# Patient Record
Sex: Female | Born: 1950 | Race: White | Hispanic: No | Marital: Single | Smoking: Former smoker
Health system: Southern US, Community
[De-identification: ages and names within clinical notes are randomized; demographics above are authoritative.]

## PROBLEM LIST (undated history)

## (undated) DIAGNOSIS — E871 Hypo-osmolality and hyponatremia: Secondary | ICD-10-CM

## (undated) DIAGNOSIS — F319 Bipolar disorder, unspecified: Secondary | ICD-10-CM

## (undated) DIAGNOSIS — R609 Edema, unspecified: Secondary | ICD-10-CM

## (undated) DIAGNOSIS — K219 Gastro-esophageal reflux disease without esophagitis: Secondary | ICD-10-CM

## (undated) DIAGNOSIS — F419 Anxiety disorder, unspecified: Secondary | ICD-10-CM

## (undated) DIAGNOSIS — Z9289 Personal history of other medical treatment: Secondary | ICD-10-CM

## (undated) DIAGNOSIS — I10 Essential (primary) hypertension: Secondary | ICD-10-CM

## (undated) DIAGNOSIS — E079 Disorder of thyroid, unspecified: Secondary | ICD-10-CM

## (undated) DIAGNOSIS — G4733 Obstructive sleep apnea (adult) (pediatric): Secondary | ICD-10-CM

## (undated) DIAGNOSIS — E039 Hypothyroidism, unspecified: Secondary | ICD-10-CM

## (undated) DIAGNOSIS — M7989 Other specified soft tissue disorders: Secondary | ICD-10-CM

## (undated) DIAGNOSIS — E66812 Obesity, class 2: Secondary | ICD-10-CM

## (undated) DIAGNOSIS — H01113 Allergic dermatitis of right eye, unspecified eyelid: Secondary | ICD-10-CM

## (undated) DIAGNOSIS — E8770 Fluid overload, unspecified: Secondary | ICD-10-CM

## (undated) DIAGNOSIS — D7589 Other specified diseases of blood and blood-forming organs: Secondary | ICD-10-CM

## (undated) DIAGNOSIS — Z6835 Body mass index (BMI) 35.0-35.9, adult: Secondary | ICD-10-CM

## (undated) DIAGNOSIS — R748 Abnormal levels of other serum enzymes: Secondary | ICD-10-CM

## (undated) DIAGNOSIS — I5032 Chronic diastolic (congestive) heart failure: Principal | ICD-10-CM

## (undated) DIAGNOSIS — M25552 Pain in left hip: Secondary | ICD-10-CM

## (undated) DIAGNOSIS — R1013 Epigastric pain: Secondary | ICD-10-CM

## (undated) DIAGNOSIS — K209 Esophagitis, unspecified without bleeding: Secondary | ICD-10-CM

## (undated) DIAGNOSIS — Z1211 Encounter for screening for malignant neoplasm of colon: Secondary | ICD-10-CM

## (undated) DIAGNOSIS — K9509 Other complications of gastric band procedure: Secondary | ICD-10-CM

## (undated) HISTORY — DX: Anxiety disorder, unspecified: F41.9

## (undated) HISTORY — DX: Essential (primary) hypertension: I10

## (undated) HISTORY — DX: Obstructive sleep apnea (adult) (pediatric): G47.33

## (undated) HISTORY — DX: Hypothyroidism, unspecified: E03.9

## (undated) HISTORY — PX: LAPAROSCOPIC GASTRIC BANDING: SHX1100

## (undated) HISTORY — DX: Disorder of thyroid, unspecified: E07.9

## (undated) HISTORY — PX: CHOLECYSTECTOMY: SHX55

## (undated) HISTORY — PX: UVULOPALATOPHARYNGOPLASTY: SHX827

## (undated) HISTORY — DX: Bipolar disorder, unspecified: F31.9

## (undated) HISTORY — DX: Gastro-esophageal reflux disease without esophagitis: K21.9

## (undated) HISTORY — PX: ABDOMINAL HYSTERECTOMY: SHX81

## (undated) HISTORY — PX: APPENDECTOMY: SHX54

## (undated) HISTORY — PX: TUBAL LIGATION: SHX77

## (undated) HISTORY — PX: HIP RESURFACING: SHX1760

---

## 1998-10-22 ENCOUNTER — Ambulatory Visit: Admission: RE | Admit: 1998-10-22 | Discharge: 1998-10-22 | Payer: Self-pay | Admitting: Family Medicine

## 1999-07-28 ENCOUNTER — Encounter: Payer: Self-pay | Admitting: Gastroenterology

## 1999-07-28 ENCOUNTER — Encounter (INDEPENDENT_AMBULATORY_CARE_PROVIDER_SITE_OTHER): Payer: Self-pay

## 1999-07-28 ENCOUNTER — Ambulatory Visit (HOSPITAL_COMMUNITY): Admission: RE | Admit: 1999-07-28 | Discharge: 1999-07-29 | Payer: Self-pay | Admitting: *Deleted

## 2002-09-21 ENCOUNTER — Inpatient Hospital Stay (HOSPITAL_COMMUNITY): Admission: EM | Admit: 2002-09-21 | Discharge: 2002-09-22 | Payer: Self-pay | Admitting: Emergency Medicine

## 2002-09-21 ENCOUNTER — Encounter: Payer: Self-pay | Admitting: Emergency Medicine

## 2002-09-22 ENCOUNTER — Encounter: Payer: Self-pay | Admitting: Family Medicine

## 2003-01-25 ENCOUNTER — Encounter (INDEPENDENT_AMBULATORY_CARE_PROVIDER_SITE_OTHER): Payer: Self-pay

## 2003-01-25 ENCOUNTER — Ambulatory Visit (HOSPITAL_COMMUNITY): Admission: RE | Admit: 2003-01-25 | Discharge: 2003-01-25 | Payer: Self-pay | Admitting: Gastroenterology

## 2004-02-03 ENCOUNTER — Other Ambulatory Visit (HOSPITAL_COMMUNITY): Admission: RE | Admit: 2004-02-03 | Discharge: 2004-03-03 | Payer: Self-pay | Admitting: Psychiatry

## 2004-02-03 ENCOUNTER — Ambulatory Visit: Payer: Self-pay | Admitting: Psychiatry

## 2004-02-04 ENCOUNTER — Inpatient Hospital Stay (HOSPITAL_COMMUNITY): Admission: RE | Admit: 2004-02-04 | Discharge: 2004-02-06 | Payer: Self-pay | Admitting: Psychiatry

## 2006-02-11 ENCOUNTER — Encounter: Admission: RE | Admit: 2006-02-11 | Discharge: 2006-02-11 | Payer: Self-pay | Admitting: Internal Medicine

## 2006-03-07 ENCOUNTER — Encounter: Admission: RE | Admit: 2006-03-07 | Discharge: 2006-03-07 | Payer: Self-pay | Admitting: Internal Medicine

## 2006-04-02 ENCOUNTER — Ambulatory Visit: Payer: Self-pay | Admitting: Internal Medicine

## 2006-04-29 ENCOUNTER — Encounter: Admission: RE | Admit: 2006-04-29 | Discharge: 2006-04-29 | Payer: Self-pay | Admitting: Internal Medicine

## 2006-08-17 ENCOUNTER — Encounter: Admission: RE | Admit: 2006-08-17 | Discharge: 2006-08-17 | Payer: Self-pay | Admitting: Internal Medicine

## 2006-10-24 ENCOUNTER — Ambulatory Visit (HOSPITAL_COMMUNITY): Admission: RE | Admit: 2006-10-24 | Discharge: 2006-10-24 | Payer: Self-pay | Admitting: *Deleted

## 2006-10-25 ENCOUNTER — Ambulatory Visit (HOSPITAL_COMMUNITY): Admission: RE | Admit: 2006-10-25 | Discharge: 2006-10-25 | Payer: Self-pay | Admitting: *Deleted

## 2006-11-12 ENCOUNTER — Encounter: Admission: RE | Admit: 2006-11-12 | Discharge: 2006-11-12 | Payer: Self-pay | Admitting: *Deleted

## 2006-11-13 ENCOUNTER — Emergency Department (HOSPITAL_COMMUNITY): Admission: EM | Admit: 2006-11-13 | Discharge: 2006-11-13 | Payer: Self-pay | Admitting: Emergency Medicine

## 2007-01-29 ENCOUNTER — Encounter: Admission: RE | Admit: 2007-01-29 | Discharge: 2007-04-29 | Payer: Self-pay | Admitting: *Deleted

## 2007-02-12 ENCOUNTER — Ambulatory Visit (HOSPITAL_COMMUNITY): Admission: RE | Admit: 2007-02-12 | Discharge: 2007-02-13 | Payer: Self-pay | Admitting: *Deleted

## 2007-09-02 ENCOUNTER — Encounter: Admission: RE | Admit: 2007-09-02 | Discharge: 2007-09-02 | Payer: Self-pay | Admitting: Internal Medicine

## 2008-01-12 ENCOUNTER — Ambulatory Visit: Payer: Self-pay | Admitting: Pulmonary Disease

## 2008-01-12 ENCOUNTER — Inpatient Hospital Stay (HOSPITAL_COMMUNITY): Admission: AD | Admit: 2008-01-12 | Discharge: 2008-01-16 | Payer: Self-pay | Admitting: Internal Medicine

## 2008-01-14 ENCOUNTER — Encounter: Payer: Self-pay | Admitting: Pulmonary Disease

## 2008-01-21 DIAGNOSIS — E0590 Thyrotoxicosis, unspecified without thyrotoxic crisis or storm: Secondary | ICD-10-CM | POA: Insufficient documentation

## 2008-01-21 DIAGNOSIS — G4730 Sleep apnea, unspecified: Secondary | ICD-10-CM | POA: Insufficient documentation

## 2008-01-22 ENCOUNTER — Ambulatory Visit: Payer: Self-pay | Admitting: Pulmonary Disease

## 2008-01-26 DIAGNOSIS — J159 Unspecified bacterial pneumonia: Secondary | ICD-10-CM | POA: Insufficient documentation

## 2008-01-31 DIAGNOSIS — J984 Other disorders of lung: Secondary | ICD-10-CM

## 2010-07-18 NOTE — Consult Note (Signed)
NAMEBERNIECE, ABID                 ACCOUNT NO.:  [id number]   MEDICAL RECORD NO.:  [id number]          PATIENT TYPE:  INP   LOCATION:  6740                         FACILITY:  [facility]   PHYSICIAN:  Oretha Milch, MD      DATE OF BIRTH:  [birth year]   DATE OF CONSULTATION:  01/13/2008  DATE OF DISCHARGE:                                 CONSULTATION   REFERRING PHYSICIAN:  Kari Baars, MD   REASON FOR CONSULTATION:  Nonresolving right upper lobe pneumonia.   HISTORY OF PRESENT ILLNESS:  Ms. Hayworth is a pleasant 60 year old  Caucasian woman who has been having intermittent fevers about once a  week since August 2009.  About 3 weeks ago, she was treated for UTI and  underwent urethral dilatation by Urology.  She then developed persistent  fevers for 3 days up to 102 and right subscapular pain with a chest x-  ray showing a right upper lobe infiltrate on January 07, 2008.  She was  treated with Avelox for 6 days, but her fever persisted.  Temperature of  99.7 was recorded in the office.  She was then admitted for inpatient  treatment of pneumonia having failed oral therapy.  She was initially  treated with ceftriaxone and azithromycin and subsequently this was  brought in to Zosyn and vancomycin on January 13, 2008.  Chest x-ray  showed unchanged right upper lobe peripheral infiltrates superior to the  fissure from January 07, 2008.  CT angiogram showed a dense  consolidation in the right upper lobe posterior segment.  Within this,  there was a low-attenuation focus, worrisome for necrosis.  Focal  opacity was seen in the right lung base measuring 1.0 x 2.4 cm, which  was stable than traced back to January 2008.   She has been afebrile since admission and oxygen saturation has been  good.  She denies sputum production or hemoptysis.  Her chest pain has  improved.   PAST MEDICAL HISTORY:  Includes hypertension, bipolar disorder,  obstructive sleep apnea, status post uvulectomy and  deviated septum  surgery, hypothyroidism, status post lap band procedure in December 2008  with a 70-pound weight loss since then, and colonic polyps.  Past  medical history also includes right lower lobe nodule stable since  January 2008.   PAST SURGICAL HISTORY:  Includes left hip bursa, appendectomy,  cholecystectomy, tubal ligation, and total abdominal hysterectomy with  BSO for fibroids, status post lap band procedure.   ALLERGIES:  CODEINE.   MEDICATIONS PRIOR TO ADMISSION:  1. Lamictal 100 mg p.o. daily.  2. Wellbutrin 300 mg p.o. daily.  3. Xanax 0.5 mg p.o. q.12 h. p.r.n.  4. Synthroid 100 mcg daily.  5. Kay Ciel 20 mEq daily.  6. Norvasc 5 mg daily.  7. 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  She has had a college education.  She is  divorced with 3 children.  She has been sober since 2005, but previously  was a heavy drinker.  She  quit smoking around 2005.  She smoked about a  pack per week then for about 20 years.  No history of drug use.   FAMILY HISTORY:  Brother and sister with alcoholism.  Father had stroke,  coronary artery disease, and gout.  Mother had depression, ALS, and  hypertension.   REVIEW OF SYSTEMS:  Currently, denies change in bowel habits.  A 70-  pound intentional weight loss.  No hemoptysis or chest pain.   PHYSICAL EXAMINATION:  GENERAL:  A __________ woman sitting up in bed in  no apparent respiratory distress.  VITAL SIGNS:  Heart rate 58 per minute, temperature 98.6, respirations  18, blood pressure 99/60, and oxygen saturation 99% on room air.  HEENT:  Normal.  NECK:  Supple.  No JVD.  No lymphadenopathy.  CARDIOVASCULAR:  S1 and S2 are normal.  CHEST:  Clear to auscultation.  ABDOMEN:  Soft and nontender.  NEUROLOGIC:  Nonfocal.  EXTREMITIES:  No edema.   LABORATORY DATA:  BUN and creatinine 7 and 0.9, potassium 3.5, sodium  141, glucose 92, bicarbonate  29, and calcium 9.1.  WBC count 5.5 on  admission, this is 8.1; hemoglobin 12.7; and platelets 287.  HIV is  nonreactive.  Urine strep antigen was negative.  LDH was 118.  LFTs were  normal.   IMAGING STUDIES:  As described above.   IMPRESSION:  1. Right upper lobe posterior segment community-acquired pneumonia.  2. Evidence of necrosis, either necrotizing pneumonia versus lung      abscess.  3. Intermittent fevers since August 2009 with unknown PPD status, but      no clear risk factors for tuberculosis or known exposure.  4. Right lower lobe nodule, stable since January 2008 and is ex-smoker   The differential diagnosis of this CT appearance and nonresolving  infiltrate in spite of antibiotic therapy includes:  1. Lung abscess and necrotizing pneumonia.  Pulmonary infarct seems to      have been ruled out.  Malignancy seems to be less likely.  Chronic      infection such as tuberculosis exists in the differential although      less likely.  Recommend a PPD will be placed.  2. I would continue with the antibiotics as you are doing.  3. I would recommend early sampling with a bronchoscopy to obtain      cultures and AFB and also inspect the right upper lobe posterior      segment to rule out endobronchial lesions.  Aspiration I presume      also exists in the differential diagnosis.  I discussed the      strategy with the patient and she evidenced understanding.  I gave      her the alternative of observation with antibiotic therapy.  The      risks of the procedure including coughing, bleeding, and a small      chance of lung puncture were discussed in detail and she evidenced      understanding.  Bronchoscopy will be planned for January 14, 2008,      a.m.  We will hold the Lovenox prior to the procedure   Thank you Dr. Clelia Croft for involving [place] in the care of this pleasant  patient.  We will be happy to follow up for her pulmonary problems as an  outpatient.      Oretha Milch, MD  Electronically Signed     RVA/MEDQ  D:  01/13/2008  T:  01/13/2008  Job:  [id number] 

## 2010-07-18 NOTE — H&P (Signed)
Sharon Mckee, Sharon Mckee                 ACCOUNT NO.:  [id number]   MEDICAL RECORD NO.:  [id number]          PATIENT TYPE:  INP   LOCATION:  6740                         FACILITY:  [facility]   PHYSICIAN:  Kari Baars, M.D.  DATE OF BIRTH:  [birth year]   DATE OF ADMISSION:  01/12/2008  DATE OF DISCHARGE:                              HISTORY & PHYSICAL   .   CHIEF COMPLAINT:  Fever, cough.   HISTORY OF PRESENT ILLNESS:  Sharon Mckee is a 60 year old [place] female  with a history of bipolar disorder, obesity, status post Lap-Band  procedure (December 2008), who presented to the office today with  persistent fever and cough.  The patient was initially seen on January 07, 2008, for intermittent fever, which has been off and on since August  2009.  This is up to 102 degrees since that time.  Initially, she fell  like her symptoms improved after treatment with antibiotics for urinary  tract infection.  She was seen by Urology and underwent a urethral  dilatation about 3 weeks ago.  However, she continued to have  intermittent fever up to 102 for the 3 days prior to her visit on  January 07, 2008.  This was associated with right subscapular pain.  At  that time, she denied any cough, dysuria, night sweats, fatigue, joint  pain, rash, or travel.  Chest x-ray was performed due to her subscapular  pain and did reveal a right upper lobe infiltrate.  She was treated with  Avelox as an outpatient.  However, she called back this morning stating  that she was having persistent fatigue, cough, and fever up to 102.5  despite completing 6 days of Avelox thus far.  She was reevaluated in  the office where her temperature was 99.7, and chest x-ray showed a  persistent right upper lobe infiltrate, unchanged from prior x-rays.  Now, she does endorse night sweats.  She has had some weight loss, which  was attributed to her Lap-Band in the past.  Minimal HIV risk factors  including no sexual activity for the past  year.  She has not had any  hemoptysis.   PAST MEDICAL HISTORY:  1. Hypertension.  2. Bipolar disorder.  3. Hypothyroidism.  4. Obstructive sleep apnea, status post uvulectomy/deviated septum.  5. 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  6. Right lung base pulmonary nodule (January 2008) - stable in June      2009.  7. Obesity, status post Lap-Band procedure.  8. History of colon polyps.  9. Status post total abdominal hysterectomy/bilateral salpingo-      oophorectomy secondary to fibroids.  10.Status post left hip bursa surgery.  11.Status post appendectomy.  12.Status post cholecystectomy.  13.Status post bilateral tubal ligation.   CURRENT MEDICATIONS:  1. Synthroid 100 mcg daily.  2. Xanax 5 mg p.r.n.  3. Wellbutrin 300 mg daily.  4. Lamictal 100 mg daily.  5. KCl 20 mEq daily.  6. Diovan HCT 320/25 daily.  7. Norvasc 5 mg daily.   ALLERGIES:  CODEINE.   SOCIAL HISTORY:  She is divorced, with  3 children and 1 grandchild.  She  has college education at [employer].  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  She quit  smoking in 2005.  Has previously been a heavy drinker with up to a  bottle of wine per day, none recently.  No drug use.   FAMILY HISTORY:  Father had stroke, coronary artery disease, and gout.  Mother had hypertension, ALS, and depression.  She has a sister with  hemochromatosis and alcoholism.  Another brother with alcoholism.  She  is a brother who died at age of 25, secondary to leukemia.  Her children  are healthy.   PHYSICAL EXAMINATION:  VITAL SIGNS:  Temperature 99.7, blood pressure  102/64, pulse 74, respirations 80, and oxygen saturation 98% on room  air.  GENERAL:  Fatigued, pale-appearing Sharon Mckee female.  HEENT:  No scleral icterus.  Pupils are equal, round, and reactive to  light.  Extraocular movements are intact.  Oropharynx is moist without  erythema.  NECK:  Supple without  lymphadenopathy, JVD, or carotid bruits.  HEART:  Regular rate and rhythm without murmurs, rubs, or gallops.  LUNGS:  Clear to auscultation bilaterally.  No wheezing or rhonchi.  No  egophony.  ABDOMEN:  Soft, nondistended, and nontender with normoactive bowel  sounds.  EXTREMITIES:  No clubbing, cyanosis, or edema.  SKIN:  No rashes.  NEUROLOGIC:  Alert and oriented x4.   LABORATORY DATA:  CBC performed on January 07, 2008, showed a Vivas  count of 8.6, hemoglobin 13.3, and platelets 284.  Peripheral blood  smear was obtained at that time and showed 2 bands, 72% segs, and 22%  lymphs.  Urinalysis at that time was negative.   STUDIES:  Chest x-ray shows a right upper lobe peripheral infiltrate  superior to the fissure.  No change from prior chest x-ray on January 07, 2008.   ASSESSMENT AND PLAN:  1. Persistent fever, night sweats, fatigue in the setting of a      persistent right upper lobe infiltrate despite Avelox therapy - her      history is a bit concerning for an atypical presentation such as      malignancy, wedge infarct, or resistant pneumonia.  We will admit      to the hospital for IV antibiotics including Rocephin and      azithromycin.  Continue antipyretics p.r.n.  We will obtain a CT of      the chest with contrast (PE protocol) to rule out PE, wedge      infarct, malignancy, and to further evaluate pneumonia.  We will      obtain sputum and blood cultures.  Consider pulmonary consult for      bronchoscopy versus percutaneous evaluation if malignancy is of      concern.  2. Hypothyroidism - continue Synthroid.  TSH last checked on April      2009 was normal.  We will recheck TSH.  3. Weight loss - this is likely secondary to Lap-Band procedure though      malignancy is of concern given above symptoms.  4. Disposition - anticipate return once evaluation and treatment is      complete.      Kari Baars, M.D.  Electronically Signed     WS/MEDQ  D:   01/12/2008  T:  01/13/2008  Job:  [id number]

## 2010-07-18 NOTE — Discharge Summary (Signed)
Sharon Mckee, Sharon Mckee                 ACCOUNT NO.:  [id number]   MEDICAL RECORD NO.:  [id number]          PATIENT TYPE:  INP   LOCATION:  6740                         FACILITY:  [facility]   PHYSICIAN:  Oretha Milch, MD      DATE OF BIRTH:  1950-03-15   DATE OF ADMISSION:  01/12/2008  DATE OF DISCHARGE:                               DISCHARGE SUMMARY   PROCEDURE PERFORMED:  Bronchoscopy.   INDICATION FOR THE PROCEDURE:  Right upper lobe consolidation with  central necrosis, not responding to outpatient therapy with oral  antibiotics and ex-smoker.  Written and informed consent was obtained  from the patient prior to procedure.  Risks of the procedure including  coughing, bleeding, and a small chance of lung puncture requiring a  chest tube were discussed in detail and she verbalized understanding.   A 5 mg of Versed and 150 mcg of fentanyl were used in divided doses.  Bronchoscope was inserted from the right nare, upper airway appeared  normal.  Vocal cord showed normal appearance and motion.  All segmental  and subsegmental airways appeared patent including the right upper lobe  airways appeared patent.  Some whitish phlegm was noted on the right-  sided airways.  The brushings and transbronchial biopsies were obtained  from the right upper lobe posterior segment using fluoroscopy about 40  mL of bronchoalveolar lavage was then instilled and good return  obtained.  Specimens were sent for AFB culture, fungal culture and  cytology and pathology.  The patient tolerated the procedure well.  A  chest x-ray will be performed to rule out the presence of pneumothorax.      Oretha Milch, MD  Electronically Signed     RVA/MEDQ  D:  01/14/2008  T:  01/14/2008  Job:  [id number]

## 2010-07-18 NOTE — Discharge Summary (Signed)
Sharon Mckee, Sharon Mckee                 ACCOUNT NO.:  [id number]   MEDICAL RECORD NO.:  [id number]          PATIENT TYPE:  INP   LOCATION:  6740                         FACILITY:  [facility]   PHYSICIAN:  Kari Baars, M.D.  DATE OF BIRTH:  [birth year]   DATE OF ADMISSION:  01/12/2008  DATE OF DISCHARGE:  01/16/2008                               DISCHARGE SUMMARY   DISCHARGE DIAGNOSES:  1. Necrotizing right upper lobe pneumonia.  2. Fever secondary to necrotizing right upper lobe pneumonia.  3. Hypotension likely secondary to antihypertensive therapy and volume      depletion, resolved.  4. Bipolar disorder with mild mania.  5. Hypothyroidism.  6. Obstructive sleep apnea, status post uvulectomy/deviated septum.  7. History of alcoholism, (none since 2005).  8. Right lung base pulmonary nodule, stable since January 2008.  9. Obesity, status post Lap Band procedure.  10.History of colon polyps.  11.Status post total abdominal hysterectomy/bilateral salpingo-      oophorectomy secondary to fibroids.  12.Status post left hip bursa surgery.  13.Status post appendectomy.  14.Status post cholecystectomy.  15.Status post bilateral tubal ligation.   DISCHARGE MEDICATIONS:  1. Augmentin 875 mg b.i.d. x10 days.  2. Lamictal 300 mg daily.  3. Levothyroxine 112 mcg daily.  4. Wellbutrin-XL 300 mg daily.  5. Xanax 1 mg q.8 h. p.r.n.   HOSPITAL PROCEDURES:  1. CT angiogram of the chest (January 12, 2008) dense consolidation in      the right upper lobe consistent with pneumonia with evidence of      pulmonary necrosis (1.2 cm x 1.6 cm x 1.7 cm), no change in 1.0 x      2.4 cm right lung base with opacity.  No pulmonary embolism.  2. Bronchoscopy (January 14, 2008) by Dr. Vassie Loll - Acid-fast bacilli,      fungus, and routine bacterial cultures negative.  Pathology shows      inflammation associated with fibrinous deposits and focal fibrosis      consistent with an acute inflammatory process.   Cytology, benign      reactive changes with acute inflammation.  3. Multiple chest x-rays revealing slow improvement.   HISTORY OF PRESENT ILLNESS:  For full details, please see dictated  history and physical.  Briefly, Sharon Mckee is a 60 year old [place] female  with history of bipolar disorder, obesity, status post Lab Band  procedure (December 2008) who presented to the office with persistent  fever and cough.  The patient has had intermittent fevers since August  2009 with spiking temperatures up to 102.  Initially, her symptoms  improved following a course of antibiotics for urinary tract infection.  She subsequently underwent urethral dilatation about 3 weeks ago.  However, after completion of these antibiotics, she continued to have  fevers up to 102 and presented to the office on January 07, 2008.  At  that time, she also reported right subscapular pain, but denied any  cough, night sweats, or sputum production.  Chest x-ray was performed  that showed a dense right upper lobe infiltrate.  She was treated with  Avelox  as an outpatient.  However, despite this treatment, she continued  to have fever up to 102.5 and returned to the office on January 12, 2008.  Repeat chest x-ray showed no significant improvement in her right  upper lobe infiltrate.  Therefore, she was admitted for further  management.   HOSPITAL COURSE:  The patient was admitted to a medical bed under  respiratory isolation.  She was empirically placed initially on Rocephin  and azithromycin.  Chest CT was performed to further evaluate the  consolidation and it did show a dense right upper lobe consolidation  with an area of necrosis consistent with necrotizing pneumonia.  No  definite abscess was seen.  Given this appearance, her antibiotics were  changed from those mentioned to vancomycin and Zosyn to cover both  anaerobic and resistant staph (MRSA).  Blood cultures were obtained on  admission and were negative.   Pulmonology was consulted and recommended  a bronchoscopy that was performed on January 14, 2008.  Cultures and  cytology from this were negative.  Cytology, as above, consistent with  an acute inflammatory change.  Of note, the patient had an immediate  reaction to vancomycin with left arm burning and lip tingling.  Therefore, the vancomycin was changed to Zyvox on January 05, 2008.  With Zyvox and Zosyn, her fever resolved and her right upper lobe  infiltrate slowly improved on chest x-ray.  At this point, it is felt  that she is showing signs of clinical improvement.  Given negative  cultures for MRSA, Dr. Vassie Loll recommended switching to p.o. Augmentin to  complete at least a 14-day course of antibiotics with close outpatient  followup.  At this time, her Zyvox will be discontinued and she will be  discharged on p.o. Augmentin.   The patient also had some episodes of hypotension throughout her  hospitalization, which were probably more related to volume depletion  and antihypertensive therapy in the setting of her weight loss from her  Lap Band.  All of her antihypertensives have been held and her blood  pressure has stabilized.  She has not shown any other signs of sepsis as  her fever has resolved and she has had no other end-organ effects.  She  will be discharged home without any antihypertensive therapy with close  outpatient followup.   DISCHARGE LABS:  Urinary strep antigen negative, urinary Legionella  antigen negative, blood cultures negative.  Bronchial washing cultures  negative for routine, fungus, and AFB.  TSH was increased was elevated  at 17.7.  Her Synthroid was increased as a result.  HIV antibody was  negative.  Peripheral blood smear was obtained with no specific report.  Of note, this was normal in office.  LDH was normal.  Random cortisol  12.6.  CBC on the day of discharge 4.2, hemoglobin 11.8, platelets 320.  Interestingly, her [place] blood count has been normal  throughout this  course.  BMET significant for sodium 145, potassium 3.9, chloride 113,  BUN 2, creatinine 0.9, glucose 89.   DISCHARGE INSTRUCTIONS:  She was instructed to call if she has  increasing shortness of breath, fever, chest pain, or purulent sputum  production.  If she does develop fever, the Zyvox should be restarted.   DISCHARGE DIET:  Cardiac prudent diet as tolerated.   DISCHARGE ACTIVITY:  She was instructed to remain out of work for about  1 week, but otherwise has no restrictions.   HOSPITAL FOLLOWUP:  She will follow up with Dr. Clelia Croft  on the January 19, 2008, for repeat chest x-ray and blood pressure followup.  She should  follow up with Dr. Vassie Loll in 1 week as instructed.   DISPOSITION:  To home.      Kari Baars, M.D.  Electronically Signed     WS/MEDQ  D:  01/16/2008  T:  01/16/2008  Job:  [id number]   cc:   Oretha Milch, MD

## 2010-07-18 NOTE — Op Note (Signed)
[name], Sharon Mckee                 ACCOUNT NO.:  [id number]   MEDICAL RECORD NO.:  [id number]          PATIENT TYPE:  OIB   LOCATION:  1539                         [facility]   PHYSICIAN:  Alfonse Ras, MD   DATE OF BIRTH:  1950/03/06   DATE OF PROCEDURE:  DATE OF DISCHARGE:                               OPERATIVE REPORT   PREOPERATIVE DIAGNOSIS:  Medically refractory morbid obesity.   POSTOPERATIVE DIAGNOSIS:  Medically refractory morbid obesity.   PROCEDURE:  Laparoscopic adjustable gastric banding with APS system.   SURGEON:  Alfonse Ras, MD.   ASSISTANTThornton Park. Daphine Deutscher, MD.   ANESTHESIA:  General.   DESCRIPTION OF PROCEDURE:  The patient was taken to the operating room  and placed in a supine position.  After general anesthesia was induced  using endotracheal tube, the abdomen was prepped and draped in normal  sterile fashion.  Using a 11-mm Optiview trocar in the left upper  quadrant under direct vision, peritoneal access was obtained.  Pneumoperitoneum was obtained.  Additional 11 and 15 mm trocars were  placed in the left abdomen and an 11-mm trocar was placed in the left  periumbilical region.  Dense adhesions were taken down from the lesser  curvature of the stomach into the falciform ligament.  This was  accomplished with a harmonic scalpel.  The angle of His was identified  and sharply and bluntly dissected.  An area of crossing fat on the right  crus of the diaphragm was identified and using the pars flaccida  technique, I passed the band passer in a retrogastric position and  brought it out of the angle of His.  An APS band was then placed through  the 15-mm port and passed around the stomach.  The sizing tube had been  placed down oro-gastrically and into the stomach.  It was snapped in  place.  A fundoplication was performed in the anterior aspect of the  stomach in a serosa to serosa fashion using #2 Ethibond sutures.  The  tubing was brought  out through the 11-mm port in the right upper  quadrant.  Incision was extended and the port was affixed to the tubing  and then affixed with interrupted 2-0 Prolene sutures to the anterior  rectus fascia.  The subcutaneous tissue was closed with subcutaneous 2-0  Vicryl suture.  The skin incisions were closed with staples.  All  tissues were injected using 0.5 Marcaine.  Sterile dressings were  applied.  The patient tolerated the procedure well and went to PACU in  good condition.      Alfonse Ras, MD  Electronically Signed     KRE/MEDQ  D:  02/12/2007  T:  02/13/2007  Job:  [id number]

## 2010-07-21 NOTE — Assessment & Plan Note (Signed)
[facility]                         GASTROENTEROLOGY OFFICE NOTE   NAME:Sharon Mckee, Sharon Mckee                        MRN:          [id number]  DATE:04/02/2006                            DOB:          04/05/1950    REASON FOR CONSULTATION:  History of diverticulitis and ongoing left  lower quadrant abdominal pain.   HISTORY:  This is a pleasant 60 year old [place] female with a history of  hypertension, hypothyroidism, anxiety/depression, and sleep apnea. She  is referred through the courtesy of Dr. Clelia Croft for persistent lower  abdominal pain presumed secondary to diverticulitis. The patient did  undergo colonoscopy with Dr. Carman Ching in November of 2004. This to  evaluate rectal bleeding. The examination was complete to the cecum with  a good preparation. The patient was said to have a descending colon  polyp, which was removed, and found to be hyperplastic. She also had  internal hemorrhoids, which were felt to be the source of bleeding.  There was no mention of diverticular change. The patient had been doing  well until recent months when she has noticed some very mild left lower  quadrant abdominal discomfort. However, in the early part of December  developed severe abdominal pain. She was seen by Dr. Clelia Croft on February 11, 2006. CBC was normal. CT scan of the abdomen and pelvis revealed  short segment sigmoid diverticulitis without perforation or abscess. She  reports to me being treated with 10-day course of two antibiotics  including metronidazole. She was not certain about the other antibiotic.  Symptoms improved, though only about 50%. She was treated with a second  course of antibiotics for ten days, again with only minimal improvement.  She was also incidentally noted to have a lesion in the right lung,  which is being followed radiographically. In addition to constant  nagging and intermittent cramping in the left lower quadrant, the  patient has  had bowel habits which have oscillated between constipation  and diarrhea. She notices some rectal bleeding due to her hemorrhoids.  Her appetite has improved recently and her weight has been steady.  Initially, she had fevers, but has not had fevers since her early  presentation in December.   PAST MEDICAL HISTORY:  1. Hypertension.  2. Hypothyroidism.  3. Anxiety with depression.  4. Sleep apnea.   PAST SURGICAL HISTORY:  1. Cholecystectomy.  2. Hysterectomy.  3. Tubal ligation.  4. Appendectomy.  5. Surgery for sleep apnea.   ALLERGIES:  CODEINE CAUSES ITCHING.   CURRENT MEDICATIONS:  1. Synthroid 50 mcg daily.  2. Wellbutrin 300 mg daily.  3. Provigil 200 mg daily.  4. Diovan hydrochlorothiazide 320/25 mg daily.  5. Potassium chloride 20 mEq daily.  6. Lamictal 100 mg daily.   FAMILY HISTORY:  No family history of gastrointestinal malignancy or  inflammatory bowel disease.   SOCIAL HISTORY:  The patient is divorced with three children. She lives  alone. She has a bachelor's degree. 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 She smokes a pack of cigarettes per week. Uses  alcohol and describes herself as  having rebound alcoholism.   REVIEW OF SYSTEMS:  Per Diagnostic Evaluation Form.   PHYSICAL EXAMINATION:  The patient is alert and oriented. Blood pressure  is 130/76, heart rate is 60 and regular. Her weight is 217.8 pounds. She  is 5 feet, 2 inches in height.  HEENT: Sclerae anicteric. Conjunctivae are pink. Oral mucosa intact.  There is no adenopathy.  LUNGS:  Are clear.  HEART: Is regular.  ABDOMEN: Obese and soft with mild to moderate  tenderness to deep  palpation in the left lower quadrant. No rebound or guarding. No  mass.  Good bowel sounds heard.  RECTAL: Examination was omitted.  EXTREMITIES: Are without edema.   IMPRESSION:  Persistent left lower quadrant abdominal pain after a  documented episode of diverticulitis. Significant, though  incomplete,  improvement after prior courses of antibiotic therapy. 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   RECOMMENDATIONS:  1. Additional and longer course of antibiotics. Will prescribe      Augmentin 875 mg p.o. b.i.d. for two weeks.  2. Low residue diet.  3. Darvocet as needed for pain.  4. Office visit in about three weeks. If symptoms persist, consider      repeat imaging study or possibly repeat colonoscopy.     Wilhemina Bonito. Marina Goodell, MD  Electronically Signed    JNP/MedQ  DD: 04/02/2006  DT: 04/02/2006  Job #: [id number]   cc:   Kari Baars, M.D.

## 2010-07-21 NOTE — Op Note (Signed)
NAME:  Sharon Mckee, Sharon Mckee                             ACCOUNT NO.:  [id number]   MEDICAL RECORD NO.:  [id number]                   PATIENT TYPE:  AMB   LOCATION:  ENDO                                 FACILITY:  [facility]   PHYSICIAN:  James L. Malon Kindle., M.D.          DATE OF BIRTH:  September 14, 1950   DATE OF PROCEDURE:  01/25/2003  DATE OF DISCHARGE:                                 OPERATIVE REPORT   PROCEDURE:  Colonoscopy and polypectomy.   MEDICATIONS:  Fentanyl 100 mcg, Versed 12 mg IV.   SCOPE:  Olympus pediatric colonoscope.   INDICATIONS:  Rectal bleeding.   DESCRIPTION OF PROCEDURE:  The procedure had been explained to the patient  and consent obtained.  With the patient in the left lateral decubitus  position, the Olympus scope was inserted and advanced.  The prep was quite  good.  Using abdominal pressure, we were able to reach the cecum.  The  ileocecal valve and appendiceal orifice seen,  the scope withdrawn and the  cecum, ascending colon, transverse colon, and descending colon were seen  well.  At approximately 60 cm from the anal verge, a 0.75 cm sessile polyp  was encountered.  Initially this looked like it might have been a lipoma.  I  did biopsy it.  I did not see any yellow fat coming from it and upon  cleaning it, it did seem to have more of a villous nature to it and seemed  to be somewhat harder than a lipoma.  We then snared it and the pieces were  recovered.  There was no significant bleeding at the termination of the  procedure.  The scope was withdrawn.  No other polyps were seen in the  descending colon or sigmoid.  The rectum did reveal moderate size internal  hemorrhoids.  The scope was withdrawn. The patient tolerated the procedure  well.   ASSESSMENT:  1. Descending colon polyp, snared.  211.3.  2. Internal hemorrhoids.  455.0.  3. Rectal bleeding, probably due to the hemorrhoids.  578.1.   PLAN:  Will go ahead and check the past.  Routine post polypectomy  instructions and hemorrhoid instructions.  See back in the office in two  months.                                               James L. Malon Kindle., M.D.    Waldron Session  D:  01/25/2003  T:  01/25/2003  Job:  [id number]   cc:   Al Decant. Janey Greaser, MD  [address]  [place]  [place] [address]  Fax: [phone]

## 2010-07-21 NOTE — Discharge Summary (Signed)
Sharon Mckee, Sharon Mckee                 ACCOUNT NO.:  [id number]   MEDICAL RECORD NO.:  [id number]          PATIENT TYPE:  IPS   LOCATION:  0301                          [facility]ACILITY:  [facility]   PHYSICIAN:  Jeanice Lim, M.D. DATE OF BIRTH:  [birth year]   DATE OF ADMISSION:  02/04/2004  DATE OF DISCHARGE:  02/06/2004                                 DISCHARGE SUMMARY   IDENTIFYING DATA:  This is a 60 year old divorced Caucasian female  presenting yesterday, planning to see [name] to begin outpatient  therapy on Monday, confessed to having suicidal thoughts, confided that she  was trying to locate somewhat that could kill her. Stated that a lot of  this may have been related to alcohol and aware of the risk of drinking and  the family history. The patient has been on multiple antidepressants in the  past. Recently evaluated at [facility] and was diagnosed with  bipolar. Advised to seek treatment regarding alcohol use and was sent here  to see [name] and Dr. Dub Mikes. The patient no longer was trying to get  someone to kill her and is not considering killing or hurting her boss.   MEDICATIONS:  1.  Abilify.  2.  Effexor.  3.  Wellbutrin.  4.  Diovan.  5.  Synthroid.  6.  Xanax p.r.n.  7.  Trazodone p.r.n. insomnia.   PHYSICAL EXAMINATION:  Essentially within normal limits.   ROUTINE ADMISSION LABORATORY DATA:  Essentially within normal limits.   MENTAL STATUS EXAM:  The patient was alert and oriented _______________ .  Anxious, depressed, somewhat labile. Denied having acute suicidal or  homicidal ideation. No evidence of psychotic symptoms. Cognition intact.  Judgment and insight fair.   ADMISSION DIAGNOSES:   AXIS I:  Alcohol dependence and bipolar disorder, likely type 1, depressed  phase.   AXIS II:  Deferred.   AXIS III:  1.  Hypertension.  2.  Hypothyroidism.  3.  Rule out sleep apnea.   AXIS IV:  Severe, occupational issues.   AXIS V:  35/55 to  60.   HOSPITAL COURSE:  The patient was admitted and ordered routine p.r.n.  medications, placed on safety checks, encouraged to participate in  individual, group, and milieu therapy. Plan was to wean patient gradually  off of Effexor and have follow up with Dr. Dub Mikes and [name] as  previously planned on following detox. The patient was safely detoxed  without complications, stabilized on medications, and reported doing well  with clinical interventions and was discharged in improved condition with no  acute withdraw symptoms, improved mood. No dangerous ideation including  suicidal or homicidal ideation. No psychotic symptoms. Has reported  motivation to remain abstinent. Follow up with after care as arranged and  was given medication education.   DISCHARGE MEDICATIONS:  1.  Diovan 160 mg 2 q.a.m.  2.  Librium 25 mg 1 three times a day for 1 day, 2 times a day for 1 day,      then 1 time a day for 1 day, and then stop.  3.  Ambien  10 mg q.h.s. p.r.n.  4.  Wellbutrin XL 1 q.a.m.  5.  Synthroid 25 mcg q.a.m.  6.  Abilify 20 mg q.a.m.  7.  Trazodone 100 mg at 9 p.m.   FOLLOW UP:  The patient was to follow up with the [facility]  CDIOP dual diagnoses program with Dr. Dub Mikes and [name].   DISCHARGE DIAGNOSES:   AXIS I:  Alcohol dependence and bipolar disorder, likely type 1, depressed  phase.   AXIS II:  Deferred.   AXIS III:  1.  Hypertension.  2.  Hypothyroidism.  3.  Rule out sleep apnea.   AXIS IV:  Severe, occupational issues.   AXIS V:  55.     Sharon Mckee   JEM/MEDQ  D:  03/01/2004  T:  03/01/2004  Job:  [id number]

## 2010-07-21 NOTE — Op Note (Signed)
Sharon Mckee. [facility]  Patient:    Sharon Mckee, Sharon Mckee                          MRN: [id number] Proc. Date: 07/28/99 Adm. Date:  [id number] Disc. Date: [id number] Attending:  Carlena Sax CC:         Sharon Mckee, M.D.                           Operative Report  PREOPERATIVE DIAGNOSES:  Moderate obstructive sleep apnea, nasal airway obstruction, nasal septal deviation, inferior turbinate hypertrophy and elongated soft palate and uvula.  POSTOPERATIVE DIAGNOSES:  Moderate obstructive sleep apnea, nasal airway obstruction, nasal septal deviation, inferior turbinate hypertrophy and elongated soft palate and uvula.  PROCEDURE PERFORMED:  Uvulopalatopharyngoplasty, nasal septal reconstruction and intramural cauterization, both inferior turbinates.  SURGEON:  Veverly Fells. Arletha Grippe, M.D.  ANESTHESIA:  General endotracheal.  INDICATIONS FOR SURGERY:  This is a 60 year old Sharon Mckee female who gives a long history of nasal airway obstruction, daytime somnolence, morning fatigue and loud snoring.  She had an inpatient sleep test performed by Dr. Dellis Anes. Heller on October 22, 1998 which did show moderate obstructive sleep apnea with an RDI of 38 events per hour and desaturations down to 90%. She has attempted nasal CPAP but this has been very difficult for her to tolerate due to nasal congestion and problems with nasal anatomy.  Physical examination does show a moderate septal deviation to the left with boggy congestion of the inferior turbinates, elongated soft palate and uvula; she is status post tonsillectomy in the past.  Based on her history, physical examination and a failure of aggressive medical therapy, I have recommended proceeding with the above-noted surgical procedure.  I have discussed extensively with her the risks and benefits of surgery including risks of general anesthesia, infection and bleeding and possible nasal septal perforation and uvulopharyngeal  insufficiency with her.  I have entertained questions, answered them appropriately, informed consent has been obtained and patient presents for the above-noted procedure.  OPERATIVE FINDINGS:  Septal deviation to the left, inferior turbinate congestion and hypertrophy, elongated soft palate and uvula.  DETAILS OF PROCEDURE PERFORMED:  Patient was brought into the operating room and placed in supine position and general endotracheal anesthesia administered via the anesthesiologist without complications.  Patient was administered Ancef 1 g IV x 2 and Decadron 10 mg IV x 1.  The head of the table was turned 90 degrees.  Patients face was draped in standard fashion.  Crowe-Davis mouth retractor was inserted into the oral cavity and this was used to retract the mouth open.  Harmonic scalpel was then used to dissect a portion of the anterior tonsillar pillars bilaterally off of the muscle, such that I was taking down scar tissue and releasing vertical scar bands.  A horizontal incision was made through the soft palpate just above the uvula attachment; this was made with the harmonic scalpel and the uvulopalatopharyngoplasty was performed in this fashion to create a posteriorly based trap-door flap.  After this was done, the uvula and portion and soft palate were removed through the oral cavity and bleeding was controlled with suction cautery without difficulty.  Anterior and posterior tonsillar pillars and cut edges of the palate were reapproximated with interrupted 3-0 Vicryl suture without difficulty and a total of 4 cc of 0.5% Marcaine solution and 1:200,000 epinephrine were infiltrate into the soft palate and  tongue base bilaterally. Crowe-davis mouth retractors were released and brought through oral cavity without incident.  Next, attention was turned to the nasal chamber.  Cotton pledgets soaked in a 4% cocaine solution were placed in both nares, left in place for approximately 5 to 10  minutes and then removed.  Both sides of the septum were infiltrated with a 1% lidocaine solution and 1:100,000 epinephrine.  After waiting approximately 5 to 10 minutes, a standard Killian incision was made on the left side of the septum.  Mucoperichondrial and mucoperiosteal flap was elevated on the left side using both blunt and sharp dissection.  An intercartilaginous incision was made approximately 1 cm posterior from the caudal line of the septum.  Mucoperichondrial and mucoperiosteal flap was elevated on the right side using both blunt and sharp dissection.  Cartilaginous deviation was removed from the ______ , posterior bony deflection removed with the Jansen-Middleton forceps.  A piece of morcellized cartilage was placed in between the septal flaps and septal incision was closed with interrupted chromic suture and the septum was reinforced with a transseptal plain gut mattress stitch.  Both inferior turbinates were then injected with a total of 6 cc of a 1% lidocaine solution with 1:100,000 epinephrine.  Both inferior turbinates were then intramurally cauterized using the Elmed intramural cauterization unit set on a 12-watt setting, three passes through both inferior turbinates were performed without difficulty and both inferior turbinates were then out-fractured using gentle lateral pressure with a large nasal speculum.  ______ splint soaked in a Bactroban ointment solution were placed on either side of the septum and held in place with a transseptal Prolene suture.  The oral cavity was suctioned dry.  There was no evidence of any active bleeding and an oral gastric tube was placed; this was to remove any stomach contents and it was removed without incident.  Fluids given during procedure:  Approximately 1 L of crystalloid. Estimated blood loss was less than 30 cc.  Urine output was not measured. There were no drains.  As above noted, two ______ splints were placed. Specimens sent  were septal cartilage and bone for gross pathology only and portion of uvula and soft palate.  Patient tolerated procedure well without  complications and was extubated in the operating room and transferred to the recovery room in stable condition.  Sponge, instrument and needle counts were correct at the end of the procedure.  Total duration of procedure was approximately one and one-half hours.  Patient will be admitted for overnight recovery and once she has recovered well, she will be sent home on Jul 29, 1999.  She will be sent home on Augmentin elixir 400 mg p.o. t.i.d. for two weeks and Lortab elixir 250 cc with two refills, 10 to 15 cc p.o. q.4h. p.r.n. pain.  She is to have light activity, no heavy lifting or nose blowing for two weeks and a post-tonsillectomy diet for two weeks.  Both she and her son were given oral and written instructions.  They are to call with any problems of bleeding, fever or vomiting, pain, reaction to medications or any other questions.  She will follow up in the office for splint removal Thursday, May 31st, at 4:30 p.m. DD:  07/28/99 TD:  08/01/99 Job: [id number]

## 2010-07-21 NOTE — H&P (Signed)
Sharon Mckee, Sharon Mckee                 ACCOUNT NO.:  [id number]   MEDICAL RECORD NO.:  [id number]          PATIENT TYPE:  IPS   LOCATION:  0301                          [facility]ACILITY:  [facility]   PHYSICIAN:  Geoffery Lyons, M.D.      DATE OF BIRTH:  Mar 03, 1951   DATE OF ADMISSION:  02/04/2004  DATE OF DISCHARGE:                         PSYCHIATRIC ADMISSION ASSESSMENT   IDENTIFYING INFORMATION:  This is a 60 year old, divorced, [name] female.  Apparently she presented yesterday for an assessment.  She was planning to  come to see [name] and to begin outpatient therapy this coming Monday.  Somewhere during the interview, she confessed to having had suicidal  ideation.  The last active suicidal ideation was two weeks ago, and she  confided that she had thought about trying to locate someone who could kill  her.  Today, she states that a lot of this may have been the alcohol.  She  is not trying to minimize it.  She shows me her hand tremor.  She says this  is familial, her children have it, etc., etc., but she realizes that she  does need to stop drinking.  She confesses that she has stopped in the past;  however, she recently relapsed and she is drinking to the point that she  passes out.  She is embarrassing her children, etc.  She also confides that  since at least 1987, she has been on antidepressants.  Initially it was  Prozac in 1987.  She had been on other antidepressants prior to that.  She  was recently evaluated at [facility] where she was diagnosed as  bipolar depressed.  They also advised her to seek treatment regarding her  alcohol and was sent over here to [facility] to see [name].  [name], in turn, wanted her to see Dr. Dub Mikes, who she did see  a few days ago.  He was in agreement with weaning her off of her Effexor and  beginning Lamictal.  She has not at this point started the Lamictal.  Today  she states that she is no longer thinking about  getting somewhat else to  kill her, and she is not thinking of harming her boss.   PAST PSYCHIATRIC HISTORY:  As already stated, she has not had any formal  inpatient treatment.  She has been treatment outpatient-wise on  antidepressants for years.   SOCIAL HISTORY:  She has one B.S.  She will finish her second B.S. in May.  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  She has been divorced twice.  She has three  children, two sons, ages 27 and 9, and a daughter, age 71.   FAMILY HISTORY:  Everybody has issues with alcohol, marijuana.  Her mom had  a nervous breakdown when she was about the patient's age.   ALCOHOL AND DRUG HISTORY:  She says she likes to drink.  She drinks a bottle  of wine a night.  Does not drink during the day or at  work.   PRIMARY CARE [place] [facility]abe:  She has had her first appointment with Dr. Clelia Croft.  Medical problems identified so far are hypertension, hypothyroidism, and she  is scheduled for a sleep apnea test this coming Tuesday.  Apparently she has  had surgery in the past; however, she is still having issues with sleep.   CURRENT MEDICATIONS:  1.  Abilify 20 mg p.o. daily.  2.  Effexor is being weaned off.  She came in on 112.5 mg.  She was to be      cut down to 75 mg and then 37.5 mg and then stop.  3.  Wellbutrin 150 mg p.o. daily.  4.  Diovan 320 mg p.o. daily.  5.  Synthroid 0.25 mg daily.  6.  Xanax 0.5 mg p.r.n.  7.  Ambien and Trazodone for sleep.   AXIS I:  1.  Alcohol abuse, rule out dependence.  2.  Bipolar currently depressed.   AXIS II:  Deferred.   AXIS III:  1.  Hypertension.  2.  Hypothyroid.  3.  Rule out sleep apnea.   AXIS IV:  Severe, occupational issues.   AXIS V:  Thirty-five.   PLAN:  Admit for detoxification, to continue to wean off her Effexor, and to  have her followup with Dr. Dub Mikes and [name] as already planned.     Mick   MD/MEDQ  D:  02/05/2004   T:  02/06/2004  Job:  [id number]

## 2010-12-05 LAB — BASIC METABOLIC PANEL
CO2: 25 mEq/L (ref 19–32)
CO2: 29 mEq/L (ref 19–32)
Calcium: 8.6 mg/dL (ref 8.4–10.5)
Calcium: 8.9 mg/dL (ref 8.4–10.5)
Calcium: 9.1 mg/dL (ref 8.4–10.5)
Calcium: 9.1 mg/dL (ref 8.4–10.5)
Chloride: 113 mEq/L — ABNORMAL HIGH (ref 96–112)
Creatinine, Ser: 0.81 mg/dL (ref 0.4–1.2)
Creatinine, Ser: 0.87 mg/dL (ref 0.4–1.2)
Creatinine, Ser: 0.9 mg/dL (ref 0.4–1.2)
Creatinine, Ser: 0.91 mg/dL (ref 0.4–1.2)
GFR calc Af Amer: >60 mL/min (ref >60)
GFR calc Af Amer: >60 mL/min (ref >60)
GFR calc Af Amer: >60 mL/min (ref >60)
GFR calc Af Amer: >60 mL/min (ref >60)
GFR calc non Af Amer: >60 mL/min (ref >60)
GFR calc non Af Amer: >60 mL/min (ref >60)
GFR calc non Af Amer: >60 mL/min (ref >60)
Glucose, Bld: 92 mg/dL (ref 70–99)
Sodium: 141 mEq/L (ref 135–145)
Sodium: 144 mEq/L (ref 135–145)
Sodium: 145 mEq/L (ref 135–145)

## 2010-12-05 LAB — HIV ANTIBODY (ROUTINE TESTING W REFLEX): HIV: NONREACTIVE

## 2010-12-05 LAB — LEGIONELLA ANTIGEN, URINE

## 2010-12-05 LAB — CBC
Hemoglobin: 12.6 g/dL (ref 12.0–15.0)
Hemoglobin: 12.7 g/dL (ref 12.0–15.0)
Hemoglobin: 12.9 g/dL (ref 12.0–15.0)
MCHC: 33.6 g/dL (ref 30.0–36.0)
MCHC: 33.8 g/dL (ref 30.0–36.0)
MCHC: 34.4 g/dL (ref 30.0–36.0)
MCV: 97.9 fL (ref 78.0–100.0)
MCV: 98.2 fL (ref 78.0–100.0)
Platelets: 316 10*3/uL (ref 150–400)
RBC: 3.43 MIL/uL — ABNORMAL LOW (ref 3.87–5.11)
RBC: 3.59 MIL/uL — ABNORMAL LOW (ref 3.87–5.11)
RBC: 3.81 MIL/uL — ABNORMAL LOW (ref 3.87–5.11)
RBC: 3.91 MIL/uL (ref 3.87–5.11)
RDW: 14.8 % (ref 11.5–15.5)
WBC: 4.2 10*3/uL (ref 4.0–10.5)
WBC: 5.2 10*3/uL (ref 4.0–10.5)
WBC: 8.1 10*3/uL (ref 4.0–10.5)

## 2010-12-05 LAB — CULTURE, RESPIRATORY W GRAM STAIN

## 2010-12-05 LAB — FUNGUS CULTURE W SMEAR: Fungal Smear: NONE SEEN

## 2010-12-05 LAB — TSH: TSH: 17.714 u[IU]/mL — ABNORMAL HIGH (ref 0.350–4.500)

## 2010-12-05 LAB — COMPREHENSIVE METABOLIC PANEL
AST: 25 U/L (ref 0–37)
Albumin: 3.3 g/dL — ABNORMAL LOW (ref 3.5–5.2)
Alkaline Phosphatase: 86 U/L (ref 39–117)
BUN: 12 mg/dL (ref 6–23)
CO2: 28 mEq/L (ref 19–32)
GFR calc Af Amer: >60 mL/min (ref >60)
GFR calc non Af Amer: 52 mL/min — ABNORMAL LOW (ref >60)
Potassium: 3.8 mEq/L (ref 3.5–5.1)
Sodium: 137 mEq/L (ref 135–145)

## 2010-12-05 LAB — CULTURE, BLOOD (ROUTINE X 2): Culture: NO GROWTH

## 2010-12-05 LAB — AFB CULTURE WITH SMEAR (NOT AT ARMC)

## 2010-12-05 LAB — CORTISOL: Cortisol, Plasma: 12.6 ug/dL

## 2010-12-05 LAB — LACTATE DEHYDROGENASE: LDH: 118 U/L (ref 94–250)

## 2010-12-11 LAB — DIFFERENTIAL
Eosinophils Relative: 0
Lymphocytes Relative: 11 — ABNORMAL LOW
Lymphs Abs: 1
Monocytes Absolute: 0.3

## 2010-12-11 LAB — HEMOGLOBIN AND HEMATOCRIT, BLOOD: HCT: 39.3

## 2010-12-11 LAB — BASIC METABOLIC PANEL
CO2: 30
Chloride: 102
GFR calc Af Amer: >60
Sodium: 144

## 2010-12-11 LAB — CBC
HCT: 35 — ABNORMAL LOW
Hemoglobin: 12.1
RBC: 3.8 — ABNORMAL LOW
WBC: 8.9

## 2010-12-15 LAB — I-STAT 8, (EC8 V) (CONVERTED LAB)
Acid-base deficit: 3 — ABNORMAL HIGH
Glucose, Bld: 95
HCT: 43
Hemoglobin: 14.6
Operator id: 272551
Potassium: 3.7
Sodium: 143
TCO2: 24

## 2010-12-15 LAB — DIFFERENTIAL
Eosinophils Relative: 11 — ABNORMAL HIGH
Lymphocytes Relative: 43
Lymphs Abs: 2.2

## 2010-12-15 LAB — D-DIMER, QUANTITATIVE: D-Dimer, Quant: 0.57 — ABNORMAL HIGH

## 2010-12-15 LAB — POCT CARDIAC MARKERS
Operator id: 272551
Troponin i, poc: <0.05

## 2010-12-15 LAB — CBC
HCT: 38.6
Hemoglobin: 13.4
Platelets: 229
WBC: 5.2

## 2011-11-06 ENCOUNTER — Telehealth (INDEPENDENT_AMBULATORY_CARE_PROVIDER_SITE_OTHER): Payer: Self-pay

## 2011-11-06 NOTE — Telephone Encounter (Signed)
Pt calling in b/c she is a lap band pt of Dr Tami Lin and she said she saw Dr Daphine Deutscher once back in 2009. The pt has not been seen since 2009. The pt is been having nausea for 26month but in the last week it has gotten really bad to where she is not eating food b/c she is afraid to throw up. The pt has only felt like throwing up one time last week. The pt has been drinking fluids but has started to back off fluids now b/c of the nausea. I advised the pt that she must drink fluids or she will get dehydrated very quickly. The pt was given an appt to see Dr Daphine Deutscher on 10/17 b/c that is the first available appt time but the pt said she can't wait that long to see Dr Daphine Deutscher. The pt went to a walk in clinic this weekend but they told her nothing was wrong. I advised pt to see her regular PCP but the pt just started a new job and she really thinks it's related to her band now so she just wants to see Dr Daphine Deutscher. I adv. Pt that I would leave him and Danna a message to see if there is anything different we can do for her now. The pt said to call her before 5pm on [phone] or after 5pm [phone].

## 2011-11-08 ENCOUNTER — Telehealth (INDEPENDENT_AMBULATORY_CARE_PROVIDER_SITE_OTHER): Payer: Self-pay | Admitting: General Surgery

## 2011-11-08 NOTE — Telephone Encounter (Signed)
Called patient back at [phone] (per prior message left on 11/06/11) advising her message was received and that a slot opened up and her appointment has been moved up to 11/21/11 at 10:40. Advised the patient in message to arrive at clinic at 10:10 since she has not been seen since 2009 and considered a new patient. Advised to bring insurance information as well. Left clinic number for her to call back if needed.

## 2011-11-12 ENCOUNTER — Telehealth (INDEPENDENT_AMBULATORY_CARE_PROVIDER_SITE_OTHER): Payer: Self-pay | Admitting: General Surgery

## 2011-11-12 ENCOUNTER — Encounter (INDEPENDENT_AMBULATORY_CARE_PROVIDER_SITE_OTHER): Payer: Self-pay | Admitting: Surgery

## 2011-11-12 NOTE — Telephone Encounter (Signed)
Called pt to find out if she wanted to reschedule todays appt due to missing it.  She informed me that she was never told about her appt today.  I rescheduled her for 9/11 at 4:45 and she was fine with this.

## 2011-11-14 ENCOUNTER — Other Ambulatory Visit (INDEPENDENT_AMBULATORY_CARE_PROVIDER_SITE_OTHER): Payer: Self-pay

## 2011-11-14 ENCOUNTER — Encounter (INDEPENDENT_AMBULATORY_CARE_PROVIDER_SITE_OTHER): Payer: Self-pay | Admitting: Surgery

## 2011-11-14 ENCOUNTER — Ambulatory Visit (INDEPENDENT_AMBULATORY_CARE_PROVIDER_SITE_OTHER): Payer: BC Managed Care – PPO | Admitting: Surgery

## 2011-11-14 VITALS — BP 120/68 | HR 68 | Temp 97.0°F | Resp 16 | Ht 62.0 in | Wt 183.8 lb

## 2011-11-14 DIAGNOSIS — Z9884 Bariatric surgery status: Secondary | ICD-10-CM | POA: Insufficient documentation

## 2011-11-14 NOTE — Progress Notes (Signed)
Sharon Mckee Body mass index is 33.62 kg/(m^2).  Having regurgitation:  yes  Nocturnal reflux?  yes  Amount of fill  -3.5  Giving her a band vacation for 3 weeks.  She has been doing OK otherwise.   Return 3 weeks

## 2011-11-14 NOTE — Patient Instructions (Signed)

## 2011-11-21 ENCOUNTER — Encounter (INDEPENDENT_AMBULATORY_CARE_PROVIDER_SITE_OTHER): Payer: Self-pay | Admitting: Surgery

## 2011-12-06 ENCOUNTER — Encounter (INDEPENDENT_AMBULATORY_CARE_PROVIDER_SITE_OTHER): Payer: Self-pay

## 2011-12-06 ENCOUNTER — Ambulatory Visit (INDEPENDENT_AMBULATORY_CARE_PROVIDER_SITE_OTHER): Payer: BC Managed Care – PPO | Admitting: Physician Assistant

## 2011-12-06 VITALS — BP 154/90 | HR 73 | Temp 98.9°F | Ht 62.0 in | Wt 191.4 lb

## 2011-12-06 DIAGNOSIS — Z4651 Encounter for fitting and adjustment of gastric lap band: Secondary | ICD-10-CM

## 2011-12-06 NOTE — Patient Instructions (Signed)
Take clear liquids tonight. Thin protein shakes are ok to start tomorrow morning. Slowly advance your diet thereafter. Call us if you have persistent vomiting or regurgitation, night cough or reflux symptoms. Return as scheduled or sooner if you notice no changes in hunger/portion sizes.  

## 2011-12-06 NOTE — Progress Notes (Signed)
  HISTORY: Sharon Mckee is a 61 y.o.female who received an AP-Standard lap-band in December 2008 by Dr. Colin Benton. She was seen by Dr. Daphine Deutscher about 3 weeks ago for reflux/regurgitation and obstructive symptoms. He removed 3.5 mL from the band. Since then she no longer has those symptoms, although she's developed a mixed dry/productive cough as well as persistent dependent edema. She also describes sweet cravings and increased intake of ice cream/cheesecake and the like. She's a bit apprehensive about an adjustment but is more worried about continued weight gain.  VITAL SIGNS: Filed Vitals:   12/06/11 1211  BP: 154/90  Pulse: 73  Temp: 98.9 F (37.2 C)    PHYSICAL EXAM: Physical exam reveals a very well-appearing 61 y.o.female in no apparent distress Neurologic: Awake, alert, oriented Psych: Bright affect, conversant Respiratory: Breathing even and unlabored. No stridor or wheezing Abdomen: Soft, nontender, nondistended to palpation. Incisions well-healed. No incisional hernias. Port easily palpated. Extremities: Atraumatic, good range of motion. Bilateral dependent edema.  ASSESMENT: 61 y.o.  female  s/p AP-Standard lap-band.   PLAN: The patient's port was accessed with a 20G Huber needle without difficulty. Clear fluid was aspirated and 2 mL saline was added to the port to give a total predicted volume of 4.75 mL. The patient was able to swallow water without difficulty following the procedure and was instructed to take clear liquids for the next 24-48 hours and advance slowly as tolerated. I recommended she follow-up with her primary physician for investigation of her cough and dependent edema.

## 2011-12-13 ENCOUNTER — Ambulatory Visit (INDEPENDENT_AMBULATORY_CARE_PROVIDER_SITE_OTHER): Payer: BC Managed Care – PPO | Admitting: Physician Assistant

## 2011-12-13 ENCOUNTER — Encounter (INDEPENDENT_AMBULATORY_CARE_PROVIDER_SITE_OTHER): Payer: Self-pay

## 2011-12-13 VITALS — BP 126/80 | HR 80 | Temp 97.6°F | Resp 18 | Ht 62.0 in | Wt 193.0 lb

## 2011-12-13 DIAGNOSIS — Z4651 Encounter for fitting and adjustment of gastric lap band: Secondary | ICD-10-CM

## 2011-12-13 NOTE — Patient Instructions (Signed)
Take clear liquids tonight. Thin protein shakes are ok to start tomorrow morning. Slowly advance your diet thereafter. Call us if you have persistent vomiting or regurgitation, night cough or reflux symptoms. Return as scheduled or sooner if you notice no changes in hunger/portion sizes.  

## 2011-12-13 NOTE — Progress Notes (Signed)
  HISTORY: Sharon Mckee is a 61 y.o.female who received an AP-Standard lap-band in December 2008 by Dr. Colin Benton. She was seen last week for a fill but that volume was inadequate. She's still hungry. No untoward symptoms however.  VITAL SIGNS: Filed Vitals:   12/13/11 1155  BP: 126/80  Pulse: 80  Temp: 97.6 F (36.4 C)  Resp: 18    PHYSICAL EXAM: Physical exam reveals a very well-appearing 61 y.o.female in no apparent distress Neurologic: Awake, alert, oriented Psych: Bright affect, conversant Respiratory: Breathing even and unlabored. No stridor or wheezing Abdomen: Soft, nontender, nondistended to palpation. Incisions well-healed. No incisional hernias. Port easily palpated. Extremities: Atraumatic, good range of motion.  ASSESMENT: 61 y.o.  female  s/p AP-Standard lap-band.   PLAN: The patient's port was accessed with a 20G Huber needle without difficulty. Clear fluid was aspirated and 0.5 mL saline was added to the port to give a total predicted volume of 5.25 mL. The patient was able to swallow water without difficulty following the procedure and was instructed to take clear liquids for the next 24-48 hours and advance slowly as tolerated.

## 2011-12-20 ENCOUNTER — Encounter (INDEPENDENT_AMBULATORY_CARE_PROVIDER_SITE_OTHER): Payer: Self-pay | Admitting: Surgery

## 2012-02-19 ENCOUNTER — Encounter (INDEPENDENT_AMBULATORY_CARE_PROVIDER_SITE_OTHER): Payer: Self-pay

## 2012-03-06 ENCOUNTER — Encounter (INDEPENDENT_AMBULATORY_CARE_PROVIDER_SITE_OTHER): Payer: Self-pay

## 2012-03-06 ENCOUNTER — Ambulatory Visit (INDEPENDENT_AMBULATORY_CARE_PROVIDER_SITE_OTHER): Payer: BC Managed Care – PPO | Admitting: Physician Assistant

## 2012-03-06 VITALS — BP 158/88 | HR 60 | Temp 98.3°F | Resp 16 | Ht 62.0 in | Wt 209.2 lb

## 2012-03-06 DIAGNOSIS — Z4651 Encounter for fitting and adjustment of gastric lap band: Secondary | ICD-10-CM

## 2012-03-06 NOTE — Progress Notes (Signed)
  HISTORY: DESHON KOSLOWSKI is a 62 y.o.female who received an AP-Standard lap-band in December 2008 by Dr. Colin Benton. She comes in with 12 lbs weight gain in three months. 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 She's having no difficulty swallowing and she says she's hungry. She would like a fill today but no to the same degree as previously.  VITAL SIGNS: Filed Vitals:   03/06/12 1127  BP: 158/88  Pulse: 60  Temp: 98.3 F (36.8 C)  Resp: 16    PHYSICAL EXAM: Physical exam reveals a very well-appearing 62 y.o.female in no apparent distress Neurologic: Awake, alert, oriented Psych: Bright affect, conversant Respiratory: Breathing even and unlabored. No stridor or wheezing Abdomen: Soft, nontender, nondistended to palpation. Incisions well-healed. No incisional hernias. Port easily palpated. Extremities: Atraumatic, good range of motion.  ASSESMENT: 62 y.o.  female  s/p AP-Standard lap-band.   PLAN: The patient's port was accessed with a 20G Huber needle without difficulty. Clear fluid was aspirated and 0.5 mL saline was added to the port to give a total predicted volume of 5.75 mL. The patient was able to swallow water without difficulty following the procedure and was instructed to take clear liquids for the next 24-48 hours and advance slowly as tolerated. She is currently seeing a therapist. I recommended that she bring up her sweets issue with her therapist as they may have coping strategies.

## 2012-03-06 NOTE — Patient Instructions (Signed)
Take clear liquids tonight. Thin protein shakes are ok to start tomorrow morning. Slowly advance your diet thereafter. Call us if you have persistent vomiting or regurgitation, night cough or reflux symptoms. Return as scheduled or sooner if you notice no changes in hunger/portion sizes.  

## 2012-06-05 ENCOUNTER — Encounter (INDEPENDENT_AMBULATORY_CARE_PROVIDER_SITE_OTHER): Payer: BC Managed Care – PPO

## 2012-06-19 ENCOUNTER — Ambulatory Visit (INDEPENDENT_AMBULATORY_CARE_PROVIDER_SITE_OTHER): Payer: BC Managed Care – PPO | Admitting: Physician Assistant

## 2012-06-19 ENCOUNTER — Encounter (INDEPENDENT_AMBULATORY_CARE_PROVIDER_SITE_OTHER): Payer: Self-pay

## 2012-06-19 DIAGNOSIS — K219 Gastro-esophageal reflux disease without esophagitis: Secondary | ICD-10-CM

## 2012-06-19 DIAGNOSIS — Z9884 Bariatric surgery status: Secondary | ICD-10-CM

## 2012-06-19 NOTE — Progress Notes (Signed)
  HISTORY: Sharon Mckee is a 62 y.o.female who received an AP-Standard lap-band in December 2008 by Dr. Colin Benton. She comes in with complaints of weight gain, hunger and being capable of "eating pretty much anything." Unfortunately she continues to complain of nocturnal reflux resulting in night cough. She will often have to sleep in a recliner. She occasionally takes a PPI when she takes pain medication for her back. She's understandably frustrated with her weight gain and symptoms.  VITAL SIGNS: Filed Vitals:   06/19/12 1144  BP: 174/96  Pulse: 61    PHYSICAL EXAM: Physical exam reveals a very well-appearing 62 y.o.female in no apparent distress Neurologic: Awake, alert, oriented Psych: Bright affect, conversant Respiratory: Breathing even and unlabored. No stridor or wheezing Extremities: Atraumatic, good range of motion. Skin: Warm, Dry, no rashes Musculoskeletal: Normal gait, Joints normal  ASSESMENT: 62 y.o.  female  s/p AP-standard lap-band.   PLAN: I've ordered an upper GI to check the position and function of the band. I've also recommended that she start omeprazole (which she has at home) daily. We'll have her return after the study to go over the results and take action if needed.

## 2012-06-19 NOTE — Patient Instructions (Signed)
Return after your x-ray. Focus on good food choices as well as physical activity. Return sooner if you have an increase in hunger, portion sizes or weight. Return also for difficulty swallowing, night cough, reflux.   

## 2012-06-26 ENCOUNTER — Encounter (INDEPENDENT_AMBULATORY_CARE_PROVIDER_SITE_OTHER): Payer: Self-pay

## 2012-06-26 ENCOUNTER — Ambulatory Visit (INDEPENDENT_AMBULATORY_CARE_PROVIDER_SITE_OTHER): Payer: BC Managed Care – PPO | Admitting: Physician Assistant

## 2012-06-26 ENCOUNTER — Ambulatory Visit
Admission: RE | Admit: 2012-06-26 | Discharge: 2012-06-26 | Disposition: A | Discharge status: Home / Self Care | Payer: Self-pay | Source: Ambulatory Visit | Attending: Physician Assistant | Admitting: Physician Assistant

## 2012-06-26 VITALS — BP 144/94 | HR 83 | Temp 99.4°F | Ht 62.0 in | Wt 216.8 lb

## 2012-06-26 DIAGNOSIS — K219 Gastro-esophageal reflux disease without esophagitis: Secondary | ICD-10-CM

## 2012-06-26 DIAGNOSIS — Z4651 Encounter for fitting and adjustment of gastric lap band: Secondary | ICD-10-CM

## 2012-06-26 DIAGNOSIS — Z9884 Bariatric surgery status: Secondary | ICD-10-CM

## 2012-06-26 NOTE — Patient Instructions (Signed)
Return in three weeks. Focus on good food choices as well as physical activity. Return sooner if you have an increase in hunger, portion sizes or weight. Return also for difficulty swallowing, night cough, reflux.   

## 2012-06-26 NOTE — Progress Notes (Signed)
  HISTORY: Sharon Mckee is a 62 y.o.female who received an AP-Standard lap-band in December 2008 by Dr. Colin Benton. She was seen last week for persistent night cough and reflux. She had an upper GI today that showed significant narrowing through the band but no evidence of slip. The band orientation is normal. There is some dilation proximally but it doesn't have the appearance of pouch dilatation from a slip. She is still having symptoms that she described last week.  VITAL SIGNS: Filed Vitals:   06/26/12 1120  BP: 144/94  Pulse: 83  Temp: 99.4 F (37.4 C)    PHYSICAL EXAM: Physical exam reveals a very well-appearing 62 y.o.female in no apparent distress Neurologic: Awake, alert, oriented Psych: Bright affect, conversant Respiratory: Breathing even and unlabored. No stridor or wheezing Abdomen: Soft, nontender, nondistended to palpation. Incisions well-healed. No incisional hernias. Port easily palpated. Extremities: Atraumatic, good range of motion.  ASSESMENT: 62 y.o.  female  s/p AP-Standard lap-band.   PLAN: The patient's port was accessed with a 20G Huber needle without difficulty. Clear fluid was aspirated and 5.5 mL saline was removed from the port to give a total predicted volume of 0 mL. The patient was advised to concentrate on healthy food choices and to avoid slider foods high in fats and carbohydrates. We'll have her on a band holiday for three weeks. I suspect she'll feel much better after today but if not, she will call for an earlier appointment.

## 2012-07-17 ENCOUNTER — Encounter (INDEPENDENT_AMBULATORY_CARE_PROVIDER_SITE_OTHER): Payer: BC Managed Care – PPO

## 2012-08-11 ENCOUNTER — Telehealth (INDEPENDENT_AMBULATORY_CARE_PROVIDER_SITE_OTHER): Payer: Self-pay | Admitting: *Deleted

## 2012-08-11 NOTE — Telephone Encounter (Signed)
Patient called to state that she is currently working with her PMD due to a decreased Sodium level which is concerning.  Patient wants to hold off on any lap band fills at this time until she can get her Na level under control.

## 2012-08-14 ENCOUNTER — Encounter (INDEPENDENT_AMBULATORY_CARE_PROVIDER_SITE_OTHER): Payer: BC Managed Care – PPO

## 2012-11-04 ENCOUNTER — Other Ambulatory Visit: Payer: Self-pay | Admitting: *Deleted

## 2012-11-04 DIAGNOSIS — R609 Edema, unspecified: Secondary | ICD-10-CM

## 2012-11-20 ENCOUNTER — Ambulatory Visit (INDEPENDENT_AMBULATORY_CARE_PROVIDER_SITE_OTHER): Payer: BC Managed Care – PPO | Admitting: Physician Assistant

## 2012-11-20 ENCOUNTER — Encounter (INDEPENDENT_AMBULATORY_CARE_PROVIDER_SITE_OTHER): Payer: Self-pay

## 2012-11-20 VITALS — BP 178/98 | HR 64 | Resp 14 | Ht 61.0 in | Wt 239.4 lb

## 2012-11-20 DIAGNOSIS — Z4651 Encounter for fitting and adjustment of gastric lap band: Secondary | ICD-10-CM

## 2012-11-20 NOTE — Patient Instructions (Signed)

## 2012-11-20 NOTE — Progress Notes (Signed)
  HISTORY: Sharon Mckee is a 62 y.o.female who received an AP-Standard lap-band in December 2008 by Dr. Colin Benton. She was seen in April for fluid removal due to regurgitation. The band is empty. She had an upper GI which indicated stenosis prior to that visit. Unfortunately she has developed dependent edema for which she was placed on diuretics. These in turn caused hyponatremia for which she has been treated. She is now off of diuretics but her edema persists. She has no GI symptoms. She has discontinued sweets for the most part. She'd like to get back on track with weight loss but is concerned about over-restriction.  VITAL SIGNS: Filed Vitals:   11/20/12 1120  BP: 178/98  Pulse: 64  Resp: 14    PHYSICAL EXAM: Physical exam reveals a very well-appearing 62 y.o.female in no apparent distress Neurologic: Awake, alert, oriented Psych: Bright affect, conversant Respiratory: Breathing even and unlabored. No stridor or wheezing Abdomen: Soft, nontender, nondistended to palpation. Incisions well-healed. No incisional hernias. Port easily palpated. Extremities: Atraumatic, good range of motion.  ASSESMENT: 62 y.o.  female  s/p AP-Standard lap-band.   PLAN: The patient's port was accessed with a 20G Huber needle without difficulty. Clear fluid was aspirated and 2.5 mL saline was added to the port to give a total predicted volume of 2.5 mL. The patient was able to swallow water without difficulty following the procedure and was instructed to take clear liquids for the next 24-48 hours and advance slowly as tolerated.

## 2012-12-03 ENCOUNTER — Other Ambulatory Visit: Payer: Self-pay

## 2012-12-03 DIAGNOSIS — R200 Anesthesia of skin: Secondary | ICD-10-CM

## 2012-12-05 ENCOUNTER — Ambulatory Visit (HOSPITAL_COMMUNITY)
Admission: RE | Admit: 2012-12-05 | Discharge: 2012-12-05 | Disposition: A | Discharge status: Home / Self Care | Payer: BC Managed Care – PPO | Source: Ambulatory Visit

## 2012-12-05 DIAGNOSIS — R030 Elevated blood-pressure reading, without diagnosis of hypertension: Secondary | ICD-10-CM | POA: Insufficient documentation

## 2012-12-05 DIAGNOSIS — R51 Headache: Secondary | ICD-10-CM | POA: Insufficient documentation

## 2012-12-05 DIAGNOSIS — R209 Unspecified disturbances of skin sensation: Secondary | ICD-10-CM | POA: Insufficient documentation

## 2012-12-05 DIAGNOSIS — R200 Anesthesia of skin: Secondary | ICD-10-CM

## 2012-12-05 MED ORDER — GADOBENATE DIMEGLUMINE 529 MG/ML IV SOLN
20.0000 mL | Freq: Once | INTRAVENOUS | Status: AC
  Administered 2012-12-05: 20 mL via INTRAVENOUS

## 2012-12-08 ENCOUNTER — Other Ambulatory Visit: Payer: BC Managed Care – PPO

## 2012-12-10 ENCOUNTER — Encounter: Payer: Self-pay | Admitting: Vascular Surgery

## 2012-12-11 ENCOUNTER — Ambulatory Visit (INDEPENDENT_AMBULATORY_CARE_PROVIDER_SITE_OTHER): Payer: BC Managed Care – PPO | Admitting: Vascular Surgery

## 2012-12-11 ENCOUNTER — Ambulatory Visit (HOSPITAL_COMMUNITY)
Admission: RE | Admit: 2012-12-11 | Discharge: 2012-12-11 | Disposition: A | Discharge status: Home / Self Care | Payer: BC Managed Care – PPO | Source: Ambulatory Visit | Attending: Vascular Surgery | Admitting: Vascular Surgery

## 2012-12-11 ENCOUNTER — Encounter: Payer: Self-pay | Admitting: Vascular Surgery

## 2012-12-11 VITALS — BP 143/88 | HR 94 | Ht 61.0 in | Wt 237.0 lb

## 2012-12-11 DIAGNOSIS — R609 Edema, unspecified: Secondary | ICD-10-CM

## 2012-12-11 NOTE — Progress Notes (Signed)
[facility] HISTORY AND PHYSICAL   History of Present Illness:  Patient is a 62 y.o. year old female who presents for evaluation of bilateral lower extremity leg swelling. The patient has a at least a two-year history of bilateral leg swelling. This apparently was worse this past spring. Both legs are equal. She does not really stand out much at work. She denies prior history of DVT. She denies family history of varicose veins. She does have a history of obesity. She previously had a lap band and lost 80 pounds. She has regained 60 pounds of this to 2 problems that caused them to have to deflate her band. Other medical problems include sleep apnea, hypertension, anxiety, bipolar disorder, reflux, hypothyroidism all of which are currently stable. She has compression stockings but was not wearing them today. She states they're difficult to get on.  Past Medical History  Diagnosis Date  . OSA (obstructive sleep apnea)   . Hypertension   . Anxiety   . Bipolar 1 disorder   . GERD (gastroesophageal reflux disease)   . Thyroid disease   . Hypothyroid     Past Surgical History  Procedure Laterality Date  . Appendectomy    . Tubal ligation    . Abdominal hysterectomy    . Hip resurfacing    . Laparoscopic gastric banding    . Cholecystectomy    . Uvulopalatopharyngoplasty      Social History History  Substance Use Topics  . Smoking status: Former Smoker    Quit date: 12/12/2007  . Smokeless tobacco: Not on file  . Alcohol Use: Yes     Comment: socail    Family History Family History  Problem Relation Age of Onset  . Hypertension Father   . Cancer Sister   . Peripheral vascular disease Sister   . Hypertension Brother     Allergies  No Known Allergies   Current Outpatient Prescriptions  Medication Sig Dispense Refill  . ALPRAZolam (XANAX) 1 MG tablet Take by mouth as needed.       Marland Kitchen amLODipine (NORVASC) 5 MG tablet Take 1 tablet by mouth daily.       . ARIPiprazole (ABILIFY) 15 MG tablet Take 7.5 mg by mouth daily.       . Armodafinil (NUVIGIL) 250 MG tablet Take 250 mg by mouth daily.      Marland Kitchen DIOVAN 320 MG tablet Take 320 mg by mouth daily.       Marland Kitchen lamoTRIgine (LAMICTAL) 150 MG tablet Take 200 mg by mouth daily.       Marland Kitchen levothyroxine (SYNTHROID, LEVOTHROID) 200 MCG tablet Take 200 mcg by mouth daily before breakfast.       . PRENATAL VITAMINS PO Take 1 tablet by mouth daily.       . propranolol (INDERAL) 20 MG tablet Take 20 mg by mouth daily.      . TRILEPTAL 300 MG tablet        No current facility-administered medications for this visit.    ROS:   General:  No weight loss, Fever, chills  HEENT: No recent headaches, no nasal bleeding, no visual changes, no sore throat  Neurologic: No dizziness, blackouts, seizures. No recent symptoms of stroke or mini- stroke. No recent episodes of slurred speech, or temporary blindness.  Cardiac: No recent episodes of chest pain/pressure, no shortness of breath at rest.  +shortness of breath with exertion.  Denies history of atrial fibrillation or irregular heartbeat  Vascular: No history  of rest pain in feet.  No history of claudication.  No history of non-healing ulcer, No history of DVT   Pulmonary: No home oxygen, no productive cough, no hemoptysis,  No asthma or wheezing  Musculoskeletal:  [ ]  Arthritis, [ ]  Low back pain,  [ ]  Joint pain  Hematologic:No history of hypercoagulable state.  No history of easy bleeding.  No history of anemia  Gastrointestinal: No hematochezia or melena,  No gastroesophageal reflux, no trouble swallowing  Urinary: [ ]  chronic Kidney disease, [ ]  on HD - [ ]  MWF or [ ]  TTHS, [ ]  Burning with urination, [ ]  Frequent urination, [ ]  Difficulty urinating;   Skin: No rashes  Psychological: No history of anxiety,  + history of depression   Physical Examination  Filed Vitals:   12/11/12 1450  BP: 143/88  Pulse: 94  Height: 5\' 1"  (1.549 m)  Weight:  237 lb (107.502 kg)  SpO2: 100%    Body mass index is 44.8 kg/(m^2).  General:  Alert and oriented, no acute distress HEENT: Normal Neck: No bruit or JVD Pulmonary: Clear to auscultation bilaterally Cardiac: Regular Rate and Rhythm without murmur Abdomen: Soft, non-tender, non-distended, no mass Skin: No rash, no obvious varicosities Extremity Pulses:  2+ radial, brachial, femoral, dorsalis pedis, posterior tibial pulses bilaterally Musculoskeletal: No deformity or edema  Neurologic: Upper and lower extremity motor 5/5 and symmetric  DATA:  Bilateral venous duplex exam was performed today. This showed evidence of common femoral vein reflux in the right side. Otherwise the study was essentially normal. I reviewed and interpreted this study.   ASSESSMENT:  Bilateral lower extremity edema. This is currently improved. Most likely her edema symptoms are related to obesity and venous hypertension. I discussed with the patient continued efforts at weight loss. I did discuss with her that 75% of people that are in her current situation have relief for improvement of their leg swelling symptoms with weight loss.   PLAN:  She will followup on as-needed basis.  Fabienne Bruns, MD [facility] Office: [phone] Pager: [phone]

## 2012-12-18 ENCOUNTER — Ambulatory Visit (INDEPENDENT_AMBULATORY_CARE_PROVIDER_SITE_OTHER): Payer: BC Managed Care – PPO | Admitting: Physician Assistant

## 2012-12-18 ENCOUNTER — Encounter (INDEPENDENT_AMBULATORY_CARE_PROVIDER_SITE_OTHER): Payer: Self-pay

## 2012-12-18 VITALS — BP 130/80 | HR 74 | Resp 14 | Ht 62.0 in | Wt 239.8 lb

## 2012-12-18 DIAGNOSIS — Z4651 Encounter for fitting and adjustment of gastric lap band: Secondary | ICD-10-CM

## 2012-12-18 NOTE — Patient Instructions (Signed)

## 2012-12-18 NOTE — Progress Notes (Signed)
  HISTORY: Sharon Mckee is a 62 y.o.female who received an AP-Standard lap-band in December 2008 by Dr. Colin Benton. She comes in with stable weight from her last visit with a fill. She reports not feeling any restriction at all, which is easy to understand. She has but 2.5 mL in the band. She denies regurgitation or reflux. She would like a fill to get the weight off. She has seen a vascular surgeon who believes her dependent edema is weight related.  VITAL SIGNS: Filed Vitals:   12/18/12 1132  BP: 130/80  Pulse: 74  Resp: 14    PHYSICAL EXAM: Physical exam reveals a very well-appearing 62 y.o.female in no apparent distress Neurologic: Awake, alert, oriented Psych: Bright affect, conversant Respiratory: Breathing even and unlabored. No stridor or wheezing Abdomen: Soft, nontender, nondistended to palpation. Incisions well-healed. No incisional hernias. Port easily palpated. Extremities: Atraumatic, good range of motion.  ASSESMENT: 62 y.o.  female  s/p AP-Standard lap-band.   PLAN: The patient's port was accessed with a 20G Huber needle without difficulty. Clear fluid was aspirated and 1.5 mL saline was added to the port to give a total predicted volume of 4 mL. The patient was able to swallow water without difficulty following the procedure and was instructed to take clear liquids for the next 24-48 hours and advance slowly as tolerated.

## 2013-01-08 ENCOUNTER — Other Ambulatory Visit: Payer: Self-pay

## 2013-01-22 ENCOUNTER — Encounter (INDEPENDENT_AMBULATORY_CARE_PROVIDER_SITE_OTHER): Payer: BC Managed Care – PPO

## 2013-02-12 ENCOUNTER — Encounter (INDEPENDENT_AMBULATORY_CARE_PROVIDER_SITE_OTHER): Payer: BC Managed Care – PPO

## 2013-03-19 ENCOUNTER — Encounter (INDEPENDENT_AMBULATORY_CARE_PROVIDER_SITE_OTHER): Payer: BC Managed Care – PPO

## 2013-05-13 ENCOUNTER — Other Ambulatory Visit: Payer: Self-pay | Admitting: Family Medicine

## 2013-05-13 ENCOUNTER — Ambulatory Visit
Admission: RE | Admit: 2013-05-13 | Discharge: 2013-05-13 | Disposition: A | Discharge status: Home / Self Care | Payer: BC Managed Care – PPO | Source: Ambulatory Visit | Attending: Family Medicine | Admitting: Family Medicine

## 2013-05-13 DIAGNOSIS — R0602 Shortness of breath: Secondary | ICD-10-CM

## 2013-07-07 ENCOUNTER — Ambulatory Visit
Admission: RE | Admit: 2013-07-07 | Discharge: 2013-07-07 | Disposition: A | Discharge status: Home / Self Care | Payer: BC Managed Care – PPO | Source: Ambulatory Visit | Attending: Family Medicine | Admitting: Family Medicine

## 2013-07-07 ENCOUNTER — Other Ambulatory Visit: Payer: Self-pay | Admitting: Family Medicine

## 2013-07-07 DIAGNOSIS — J189 Pneumonia, unspecified organism: Secondary | ICD-10-CM

## 2013-10-08 ENCOUNTER — Other Ambulatory Visit: Payer: Self-pay | Admitting: Family Medicine

## 2013-10-08 ENCOUNTER — Ambulatory Visit
Admission: RE | Admit: 2013-10-08 | Discharge: 2013-10-08 | Disposition: A | Discharge status: Home / Self Care | Payer: BC Managed Care – PPO | Source: Ambulatory Visit | Attending: Family Medicine | Admitting: Family Medicine

## 2013-10-08 DIAGNOSIS — R9389 Abnormal findings on diagnostic imaging of other specified body structures: Secondary | ICD-10-CM

## 2014-03-04 DIAGNOSIS — K625 Hemorrhage of anus and rectum: Secondary | ICD-10-CM

## 2014-03-04 DIAGNOSIS — I771 Stricture of artery: Secondary | ICD-10-CM

## 2014-03-04 DIAGNOSIS — F32A Depression, unspecified: Secondary | ICD-10-CM

## 2014-03-04 DIAGNOSIS — F329 Major depressive disorder, single episode, unspecified: Secondary | ICD-10-CM

## 2014-03-04 DIAGNOSIS — K5790 Diverticulosis of intestine, part unspecified, without perforation or abscess without bleeding: Secondary | ICD-10-CM | POA: Insufficient documentation

## 2014-03-09 ENCOUNTER — Ambulatory Visit: Payer: BC Managed Care – PPO | Admitting: Cardiovascular Disease

## 2014-03-29 NOTE — Progress Notes (Signed)
Patient ID: Sharon Mckee, female   DOB: October 03, 1950, 64 y.o.   MRN: [id number]    64 yo referred by Sharon Mckee for dyspnea She has had lapband in [id number] but gained most of her weight back.  History of chronic LE edema seen by Sharon Mckee in past . She denies prior history of DVT. She denies family history of varicose veins. Other medical problems include sleep apnea, hypertension, anxiety, bipolar disorder, reflux, hypothyroidism all of which are currently stable. She has compression stockings but does not wear them No history of cardiac disease  Dyspnea is chronic Can happen In shower, at work or with walking No asthma or bronchitic disease very distant smoker Compliant with meds.  Was on diuretic in past but stopped due to low sodium   Labs LDL 69  TC 200  Hct 43 Cr .8  TSH 3.46  From 05/12/13   CXR:  NAD  10/08/13     ROS: Denies fever, malais, weight loss, blurry vision, decreased visual acuity, cough, sputum, SOB, hemoptysis, pleuritic pain, palpitaitons, heartburn, abdominal pain, melena, lower extremity edema, claudication, or rash.  All other systems reviewed and negative   General: Affect appropriate Obese Sharon Mckee female  HEENT: normal Neck supple with no adenopathy JVP normal no bruits no thyromegaly Lungs clear with no wheezing and good diaphragmatic motion Heart:  S1/S2 no murmur,rub, gallop or click PMI normal Abdomen: benighn, BS positve, no tenderness, no AAA no bruit.  No HSM or HJR Distal pulses intact with no bruits No edema Neuro non-focal Skin warm and dry No muscular weakness  Medications Current Outpatient Prescriptions  Medication Sig Dispense Refill  . ALPRAZolam (XANAX) 1 MG tablet Take by mouth 3 (three) times daily as needed.     Marland Kitchen. amLODipine (NORVASC) 5 MG tablet Take 1 tablet by mouth daily.    Marland Kitchen. amphetamine-dextroamphetamine (ADDERALL XR) 25 MG 24 hr capsule Take 25 mg by mouth every morning.    . ARIPiprazole (ABILIFY) 10 MG tablet Take 10 mg by mouth  daily.    . Armodafinil (NUVIGIL) 250 MG tablet Take 250 mg by mouth daily.    Marland Kitchen. DIOVAN 320 MG tablet Take 320 mg by mouth daily.     . fluorometholone (FML) 0.1 % ophthalmic suspension     . lamoTRIgine (LAMICTAL) 200 MG tablet Take 200 mg by mouth daily.    Marland Kitchen. levothyroxine (SYNTHROID, LEVOTHROID) 200 MCG tablet Take 200 mcg by mouth daily before breakfast. Takes 1.5 twice a week for 90 days    . Multiple Vitamins-Minerals (MULTIVITAMIN WITH MINERALS) tablet Take 1 tablet by mouth daily.    Marland Kitchen. PRENATAL VITAMINS PO Take 1 tablet by mouth daily.      No current facility-administered medications for this visit.    Allergies Review of patient's allergies indicates no known allergies.  Family History: Family History  Problem Relation Age of Onset  . Hypertension Father   . Heart disease Father   . Cancer Sister   . Peripheral vascular disease Sister   . Hypertension Brother     Social History: History   Social History  . Marital Status: Divorced    Spouse Name: N/A    Number of Children: N/A  . Years of Education: N/A   Occupational History  . Not on file.   Social History Main Topics  . Smoking status: Former Smoker    Quit date: 12/12/2007  . Smokeless tobacco: Not on file  . Alcohol Use: Yes  Comment: socail  . Drug Use: No  . Sexual Activity: Not on file   Other Topics Concern  . Not on file   Social History Narrative    Past Surgical History  Procedure Laterality Date  . Appendectomy    . Tubal ligation    . Abdominal hysterectomy    . Hip resurfacing    . Laparoscopic gastric banding    . Cholecystectomy    . Uvulopalatopharyngoplasty      Past Medical History  Diagnosis Date  . OSA (obstructive sleep apnea)   . Hypertension   . Anxiety   . Bipolar 1 disorder   . GERD (gastroesophageal reflux disease)   . Thyroid disease   . Hypothyroid     Electrocardiogram:  02/04/14  SR rate 63 poor R wave progression  03/30/14  SR rate 82  Normal    Assessment and Plan

## 2014-03-29 NOTE — Progress Notes (Signed)
Formatting of this note is different from the original.  Images from the original note were not included.  Patient ID: Margaret Zimmerman, female   DOB: Nov 26, 1950, 64 y.o.   MRN: [id number]    64 yo referred by Dr Paulino RilyWolters for dyspnea She has had lapband in [id number] but gained most of her weight back.  History of chronic LE edema seen by Dr Darrick PennaFields in past  . She denies prior history of DVT. She denies family history of varicose veins. Other medical problems include sleep apnea, hypertension, anxiety, bipolar disorder, reflux, hypothyroidism all of which are currently stable. She has compression stockings but does not wear them No history of cardiac disease  Dyspnea is chronic Can happen  In shower, at work or with walking No asthma or bronchitic disease very distant smoker Compliant with meds.  Was on diuretic in past but stopped due to low sodium     Labs LDL 69  TC 200  Hct 43 Cr .8  TSH 3.46  From 05/12/13     CXR:  NAD  10/08/13      ROS: Denies fever, malais, weight loss, blurry vision, decreased visual acuity, cough, sputum, SOB, hemoptysis, pleuritic pain, palpitaitons, heartburn, abdominal pain, melena, lower extremity edema, claudication, or rash.  All other systems reviewed and negative    General:  Affect appropriate  Obese Dieckman female   HEENT: normal  Neck supple with no adenopathy  JVP normal no bruits no thyromegaly  Lungs clear with no wheezing and good diaphragmatic motion  Heart:  S1/S2 no murmur,rub, gallop or click  PMI normal  Abdomen: benighn, BS positve, no tenderness, no AAA  no bruit.  No HSM or HJR  Distal pulses intact with no bruits  No edema  Neuro non-focal  Skin warm and dry  No muscular weakness    Medications  Current Outpatient Prescriptions   Medication Sig Dispense Refill   ? ALPRAZolam (XANAX) 1 MG tablet Take by mouth 3 (three) times daily as needed.      ? amLODipine (NORVASC) 5 MG tablet Take 1 tablet by mouth daily.     ? amphetamine-dextroamphetamine (ADDERALL XR) 25 MG 24 hr capsule  Take 25 mg by mouth every morning.     ? ARIPiprazole (ABILIFY) 10 MG tablet Take 10 mg by mouth daily.     ? Armodafinil (NUVIGIL) 250 MG tablet Take 250 mg by mouth daily.     ? DIOVAN 320 MG tablet Take 320 mg by mouth daily.      ? fluorometholone (FML) 0.1 % ophthalmic suspension      ? lamoTRIgine (LAMICTAL) 200 MG tablet Take 200 mg by mouth daily.     ? levothyroxine (SYNTHROID, LEVOTHROID) 200 MCG tablet Take 200 mcg by mouth daily before breakfast. Takes 1.5 twice a week for 90 days     ? Multiple Vitamins-Minerals (MULTIVITAMIN WITH MINERALS) tablet Take 1 tablet by mouth daily.     ? PRENATAL VITAMINS PO Take 1 tablet by mouth daily.        No current facility-administered medications for this visit.     Allergies  Review of patient's allergies indicates no known allergies.    Family History:  Family History   Problem Relation Age of Onset   ? Hypertension Father    ? Heart disease Father    ? Cancer Sister    ? Peripheral vascular disease Sister    ? Hypertension Brother      Social History:  History     Social History   ? Marital Status: Divorced     Spouse Name: N/A     Number of Children: N/A   ? Years of Education: N/A     Occupational History   ? Not on file.     Social History Main Topics   ? Smoking status: Former Smoker     Quit date: 12/12/2007   ? Smokeless tobacco: Not on file   ? Alcohol Use: Yes      Comment: socail   ? Drug Use: No   ? Sexual Activity: Not on file     Other Topics Concern   ? Not on file     Social History Narrative     Past Surgical History   Procedure Laterality Date   ? Appendectomy     ? Tubal ligation     ? Abdominal hysterectomy     ? Hip resurfacing     ? Laparoscopic gastric banding     ? Cholecystectomy     ? Uvulopalatopharyngoplasty       Past Medical History   Diagnosis Date   ? OSA (obstructive sleep apnea)    ? Hypertension    ? Anxiety    ? Bipolar 1 disorder    ? GERD (gastroesophageal reflux disease)    ? Thyroid disease    ? Hypothyroid       Electrocardiogram:  02/04/14  SR rate 63 poor R wave progression  03/30/14  SR rate 82  Normal     Assessment and Plan      Electronically signed by Wendall Stade, MD at 03/30/2014  2:50 PM EST

## 2014-03-30 ENCOUNTER — Ambulatory Visit (INDEPENDENT_AMBULATORY_CARE_PROVIDER_SITE_OTHER): Payer: BLUE CROSS/BLUE SHIELD | Admitting: Cardiovascular Disease

## 2014-03-30 ENCOUNTER — Ambulatory Visit: Payer: BC Managed Care – PPO | Admitting: Cardiovascular Disease

## 2014-03-30 ENCOUNTER — Encounter: Payer: Self-pay | Admitting: Cardiovascular Disease

## 2014-03-30 VITALS — BP 144/98 | HR 82 | Ht 62.0 in | Wt 208.8 lb

## 2014-03-30 DIAGNOSIS — R609 Edema, unspecified: Secondary | ICD-10-CM

## 2014-03-30 DIAGNOSIS — R0600 Dyspnea, unspecified: Secondary | ICD-10-CM

## 2014-03-30 NOTE — Patient Instructions (Signed)

## 2014-03-30 NOTE — Assessment & Plan Note (Signed)
No reflux  Mild constriction with most of air let out due to "viral" illness  F/U GI

## 2014-03-30 NOTE — Assessment & Plan Note (Signed)
Mild dependant from obesity.  Duplex RLE no DVT  Consider stopping amlodipine and using non thiazide diuretic if she has a history of low sodium with previous HCTZ

## 2014-03-30 NOTE — Assessment & Plan Note (Signed)
Likely functional due to obesity and deconditioning Normal exam and ECG  F/U echo

## 2014-03-30 NOTE — Assessment & Plan Note (Signed)
Associated Problem(s): Lapband APS Earle Dec 2008  Formatting of this note might be different from the original.  No reflux  Mild constriction with most of air let out due to "viral" illness  F/U GI   Electronically signed by Wendall StadeNishan, Peter C, MD at 03/30/2014  2:48 PM EST

## 2014-03-30 NOTE — Assessment & Plan Note (Signed)
Associated Problem(s): Dyspnea  Formatting of this note might be different from the original.  Likely functional due to obesity and deconditioning Normal exam and ECG  F/U echo   Electronically signed by Wendall StadeNishan, Peter C, MD at 03/30/2014  2:50 PM EST

## 2014-03-30 NOTE — Assessment & Plan Note (Signed)
Associated Problem(s): Edema  Formatting of this note might be different from the original.  Mild dependant from obesity.  Duplex RLE no DVT  Consider stopping amlodipine and using non thiazide diuretic if she has a history of low sodium with previous HCTZ  Electronically signed by Wendall StadeNishan, Peter C, MD at 03/30/2014  2:49 PM EST

## 2014-04-07 ENCOUNTER — Ambulatory Visit (HOSPITAL_COMMUNITY): Payer: BLUE CROSS/BLUE SHIELD | Attending: Internal Medicine

## 2014-04-07 DIAGNOSIS — R0600 Dyspnea, unspecified: Secondary | ICD-10-CM | POA: Diagnosis present

## 2014-04-07 NOTE — Progress Notes (Signed)
2D Echo completed. 04/07/2014 

## 2014-08-26 NOTE — Addendum Note (Signed)
Addended by: Drue Dun on: 08/26/2014 03:18 PM   Modules accepted: Orders

## 2014-08-26 NOTE — Addendum Note (Signed)
Addended by: Drue Dun on: 08/26/2014 03:18 PM      Modules accepted: Orders      Electronically signed by Drue Dun at 08/26/2014  3:18 PM EDT

## 2014-10-22 ENCOUNTER — Other Ambulatory Visit: Payer: Self-pay | Admitting: Family Medicine

## 2014-10-22 DIAGNOSIS — R109 Unspecified abdominal pain: Secondary | ICD-10-CM

## 2014-10-22 DIAGNOSIS — R1011 Right upper quadrant pain: Secondary | ICD-10-CM

## 2014-10-26 ENCOUNTER — Ambulatory Visit
Admission: RE | Admit: 2014-10-26 | Discharge: 2014-10-26 | Disposition: A | Discharge status: Home / Self Care | Payer: BLUE CROSS/BLUE SHIELD | Source: Ambulatory Visit | Attending: Family Medicine | Admitting: Family Medicine

## 2014-10-26 DIAGNOSIS — R1011 Right upper quadrant pain: Secondary | ICD-10-CM

## 2014-10-26 DIAGNOSIS — R109 Unspecified abdominal pain: Secondary | ICD-10-CM

## 2016-01-25 ENCOUNTER — Encounter: Admit: 2016-01-25 | Primary: Family Medicine

## 2016-01-25 ENCOUNTER — Inpatient Hospital Stay: Admit: 2016-01-25 | Discharge: 2016-01-25 | Disposition: A | Discharge status: Home / Self Care

## 2016-01-25 DIAGNOSIS — S8392XA Sprain of unspecified site of left knee, initial encounter: Secondary | ICD-10-CM

## 2016-01-25 NOTE — ED Provider Notes (Signed)
[facility]  ??  Department of Emergency Medicine   ED  Provider Note  Admit Date/RoomTime: 01/25/2016  2:50 PM  ED Room: 40/INTW-1   Chief Complaint   Knee Injury (left, twisted it last night)    History of Present Illness   Source of history provided by:  patient.  History/Exam Limitations: none.      Lynn Itoancy C Hiemstra is a 65 y.o. old female who has a past medical history of:   Past Medical History:   Diagnosis Date   ??? Hypertension     presents to the emergency department by private vehicle, for pain located anterior to left knee  which occured 1 day(s) prior to arrival.  The complaint occurred as a result of twisting injury, but denies fall.   while going to sit down. There has been a history of no prior problems with this area in the past.  Since onset the symptoms have been moderate in degree.  Her pain is aggraveated by movement, use and palpation and relieved by nothing. Tetanus Status: up to date. Her weight bearing ability: patient reports significant pain w/ambulating.    ROS    Pertinent positives and negatives are stated within HPI, all other systems reviewed and are negative.    History reviewed. No pertinent surgical history.Social History:  reports that she has never smoked. She does not have any smokeless tobacco history on file. She reports that she drinks alcohol. She reports that she does not use drugs.  Family History: family history is not on file.   Allergies: Review of patient's allergies indicates no known allergies.    Physical Exam          ED Triage Vitals [01/25/16 1447]   BP Temp Temp Source Pulse Resp SpO2 Height Weight   (!) 168/98 98.4 ??F (36.9 ??C) Temporal 76 19 100 % 5\' 2"  (1.575 m) 230 lb (104.3 kg)       Oxygen Saturation Interpretation: Normal.    Constitutional:  Alert, development consistent with age.  Neck:  Normal ROM.  Supple.  Knee:  Left patellar, tibial tubercle:             Tenderness:  moderate..              Swelling/Effusion: Mild.             Deformity: no.               ROM: full range with pain.             Skin:  no erythema, rash or wounds noted.       Drawer's:  Negative.       Lachman's: Negative.       Valgus/Varus Stress: Negative.       Crepitus: Negative.       Hip:            Tenderness:  none.              Swelling: None.             Deformity: no.              ROM: full range of motion.              Skin:  no erythema, rash or wounds noted.       Joint(s) Below: ankle.                Tenderness:  none.  Swelling: No.              Deformity: no.             ROM: full range of motion.            Skin:  no erythema, rash or wounds noted.        Neurovascular:             Motor deficit: none.              Sensory deficit: none.             Pulse deficit: none. 2+ dorsalis pedis pulse of the left foot.             Capillary refill: normal.  Gait:  limp.  Lymphatics: No lymphangitis or adenopathy noted.  Neurological:  Oriented.  Motor functions intact.    Lab / Imaging Results   (All laboratory and radiology results have been personally reviewed by myself)  Labs:  No results found for this visit on 01/25/16.  Imaging:  All Radiology results interpreted by Radiologist unless otherwise noted.  XR Knee Left Min 4 VW   Final Result   No evidence of fracture.                             ED Course / Medical Decision Making   Medications - No data to display  ED Course      Consult(s):   None    Procedure(s):   none.    Medical Decision Making:    No acute fracture seen on x-ray today. Ace wrap applied. Patient did not want crutches. Advised ice and elevate. Advised to return the ER for any worsening symptoms. Otherwise follow-up with PCP.    Counseling:   The emergency provider has spoken with the patient and discussed today???s results, in addition to providing specific details for the plan of care and counseling regarding the diagnosis and prognosis.  Questions are answered at this time and they are agreeable with the plan.    Assessment      1. Sprain of left  knee, unspecified ligament, initial encounter      Plan   Discharge to home  Patient condition is good    New Medications     New Prescriptions    No medications on file     Electronically signed by Stevenson ClinchShannon Forgac, PA   DD: 01/25/16  **This report was transcribed using voice recognition software. Every effort was made to ensure accuracy; however, inadvertent computerized transcription errors may be present.  END OF ED PROVIDER NOTE       Stevenson ClinchShannon Forgac, [place]  01/25/16 1526

## 2016-04-23 IMAGING — CR DG CHEST 2V
2 series · 2 of 2 positions shown · non-contrast
Comparison: 05/13/2013

CLINICAL DATA: Followup of pneumonia. Dyspnea. Hypertension.
Ex-smoker.

EXAM:
CHEST  2 VIEW

[w chest pa]
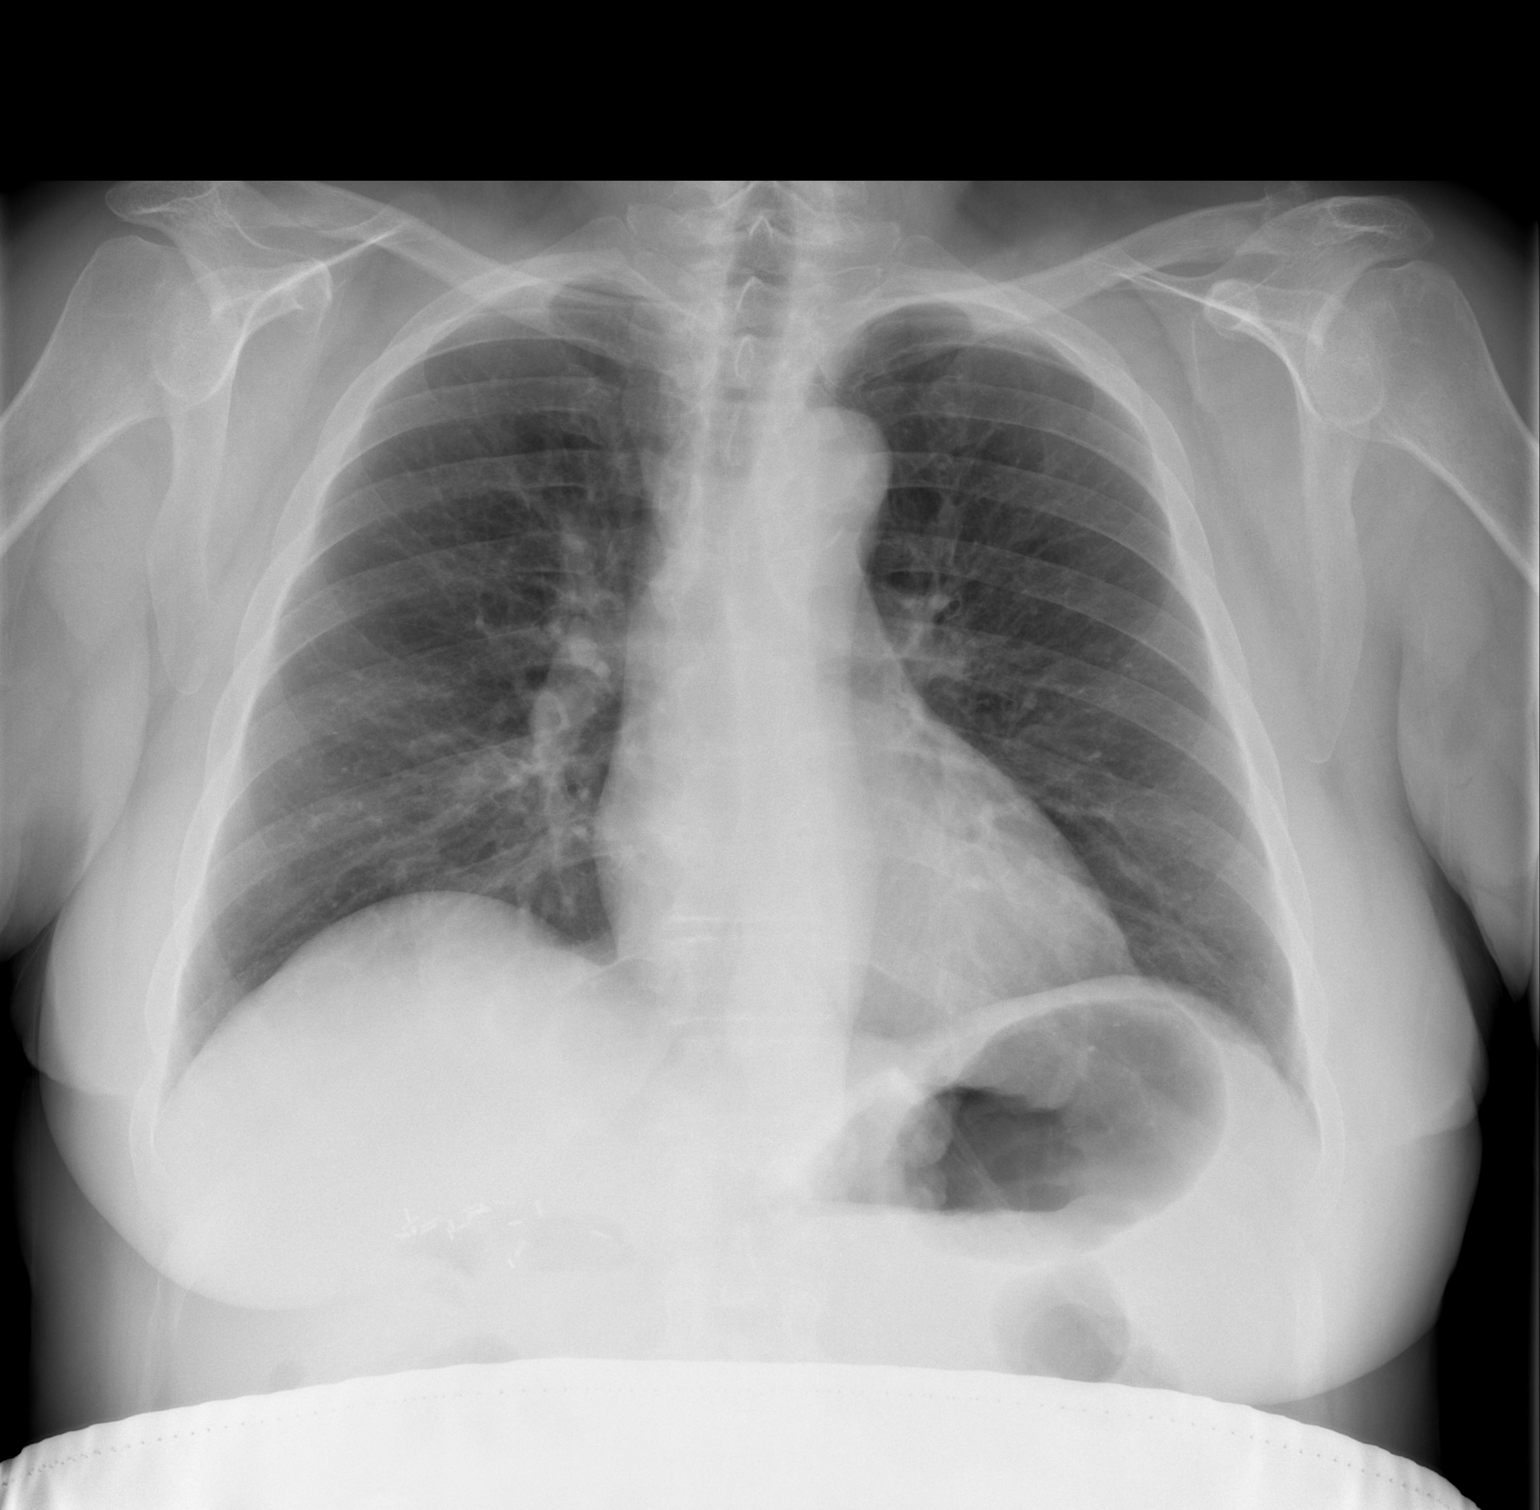

[w chest lat]
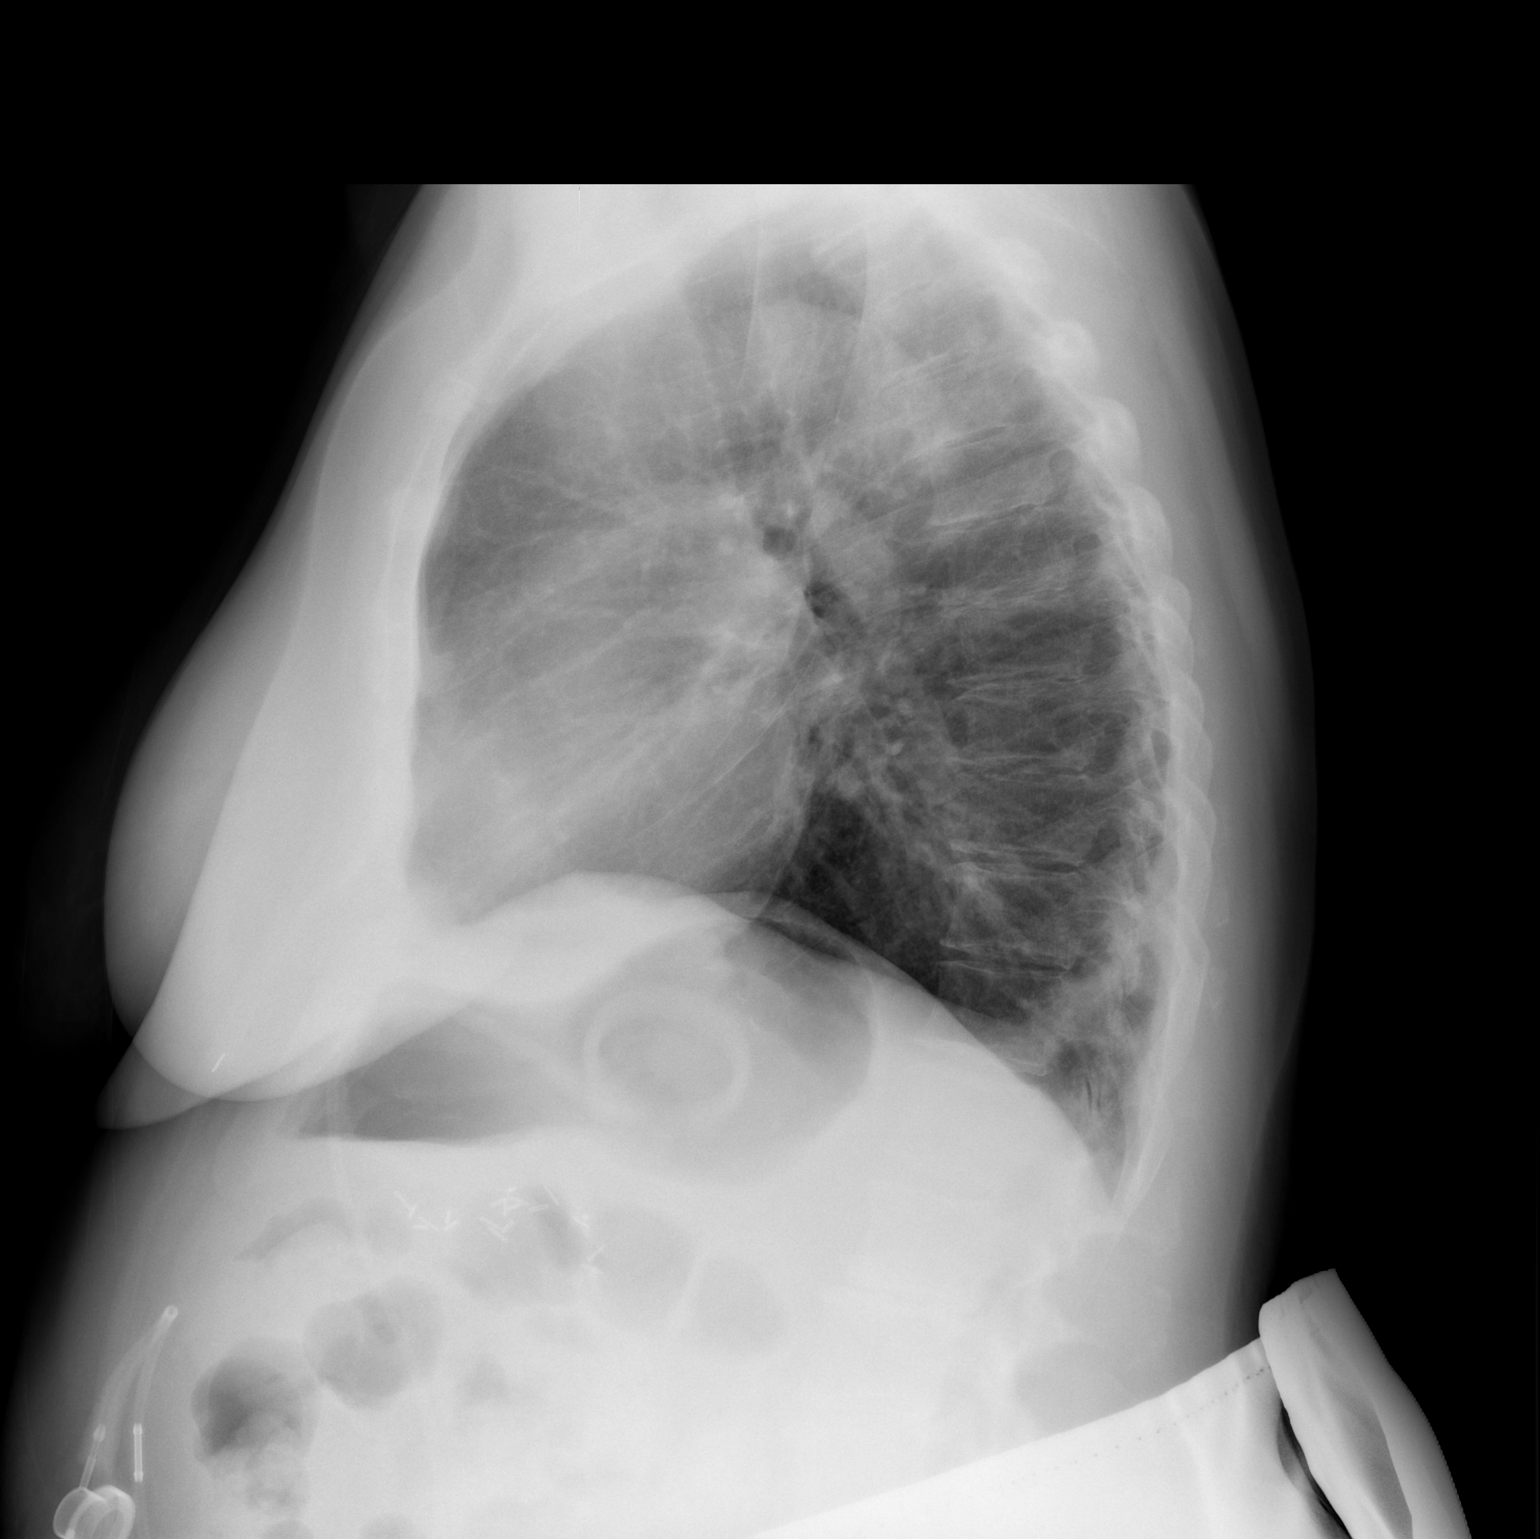

[2 of 2 positions shown; findings below may reference images not displayed]

FINDINGS: Midline trachea. Normal heart size with a tortuous thoracic aorta.
No pleural effusion or pneumothorax. Surgical changes in the right
upper quadrant. The previously described questionable right base
opacity is not appreciated today. Clear lungs.

Increase density projecting over the posterior aspect of the lower
thoracic spine on the lateral view. Similar to on the prior exam,
but appears increased when compared to 01/15/08. No correlate on the
frontal radiograph.
IMPRESSION: No acute cardiopulmonary disease. No evidence of right-sided
pneumonia.

Probable osseous summation shadow projecting over the thoracic spine
on the lateral view. Consider followup with chest radiograph at 3- 6
months to confirm stability of this appearance.

## 2016-12-21 ENCOUNTER — Telehealth: Payer: Self-pay | Admitting: *Deleted

## 2016-12-21 NOTE — Telephone Encounter (Signed)
NOTES SENT TO SCHEDULING.  °

## 2017-01-07 NOTE — Progress Notes (Signed)
Patient ID: Sharon Mckee, female   DOB: March 15, 1950, 66 y.o.   MRN: [id number]    66 y.o.  referred by Sharon Mckee for dyspnea in 2016  She has had lapband in 2008 but gained most of her weight back.  History of chronic LE edema . Other medical problems include sleep apnea, hypertension, anxiety, bipolar disorder, reflux, hypothyroidism all of which are currently stable. She has compression stockings but does not wear them No history of cardiac disease  Dyspnea is chronic No asthma or bronchitic disease very distant smoker Compliant with meds.  Was on diuretic in past but stopped due to low sodium   Echo 04/07/14 EF 60-65%  Grade one diastolic  Mild LAe No valve disease No pulmonary HTN  She has chronic LE edema and apparently can't take diuretics due to hyponatremia No chest pain Dyspnea is chronic and over 66 years old    ROS: Denies fever, malais, weight loss, blurry vision, decreased visual acuity, cough, sputum, SOB, hemoptysis, pleuritic pain, palpitaitons, heartburn, abdominal pain, melena, lower extremity edema, claudication, or rash.  All other systems reviewed and negative   General: BP 138/68   Pulse 74   Ht 5\' 2"  (1.575 m)   Wt 259 lb 8 oz (117.7 kg) Comment: PATIENT WAS 246 THIS MORNING AT HOME WEIGHING UNDRESSED  SpO2 97%   BMI 47.46 kg/m  Affect appropriate Obese [name]  HEENT: normal Neck supple with no adenopathy JVP normal no bruits no thyromegaly Lungs clear with no wheezing and good diaphragmatic motion Heart:  S1/S2 no murmur, no rub, gallop or click PMI normal Abdomen: benighn, BS positve, no tenderness, no AAA no bruit.  No HSM or HJR Distal pulses intact with no bruits Plus 2 LE edema Neuro non-focal Skin warm and dry No muscular weakness   Medications Current Outpatient Medications  Medication Sig Dispense Refill  . ALPRAZolam (XANAX) 1 MG tablet Take by mouth 3 (three) times daily as needed for anxiety.     Marland Kitchen. amLODipine (NORVASC) 5 MG tablet  Take 1 tablet by mouth daily.    Marland Kitchen. amphetamine-dextroamphetamine (ADDERALL XR) 25 MG 24 hr capsule Take 25 mg by mouth every morning.    . ARIPiprazole (ABILIFY) 30 MG tablet Take 30 mg daily by mouth.    . Armodafinil (NUVIGIL) 250 MG tablet Take 250 mg by mouth daily.    . Biotin 1 MG CAPS Take 1,000 mg daily by mouth.    . celecoxib (CELEBREX) 200 MG capsule Take 200 mg daily by mouth.    . DIOVAN 320 MG tablet Take 320 mg by mouth daily.     . DOCOSAHEXAENOIC ACID PO Take 1 g daily by mouth.    . lamoTRIgine (LAMICTAL) 200 MG tablet Take 200 mg by mouth daily.    Marland Kitchen. levothyroxine (SYNTHROID, LEVOTHROID) 100 MCG tablet Take 100 mcg daily before breakfast by mouth.    . levothyroxine (SYNTHROID, LEVOTHROID) 125 MCG tablet Take 125 mcg daily by mouth.    . Magnesium 100 MG CAPS Take daily by mouth.     No current facility-administered medications for this visit.     Allergies Patient has no known allergies.  Family History: Family History  Problem Relation Age of Onset  . Hypertension Father   . Heart disease Father   . Cancer Sister   . Peripheral vascular disease Sister   . Hypertension Brother     Social History: Social History   Socioeconomic History  . Marital status: Divorced  Spouse name: Not on file  . Number of children: Not on file  . Years of education: Not on file  . Highest education level: Not on file  Social Needs  . Financial resource strain: Not on file  . Food insecurity - worry: Not on file  . Food insecurity - inability: Not on file  . Transportation needs - medical: Not on file  . Transportation needs - non-medical: Not on file  Occupational History  . Not on file  Tobacco Use  . Smoking status: Former Smoker    Last attempt to quit: 12/12/2007    Years since quitting: 9.0  . Smokeless tobacco: Never Used  Substance and Sexual Activity  . Alcohol use: Yes    Comment: socail  . Drug use: No  . Sexual activity: Not on file  Other Topics  Concern  . Not on file  Social History Narrative  . Not on file    Past Surgical History:  Procedure Laterality Date  . ABDOMINAL HYSTERECTOMY    . APPENDECTOMY    . CHOLECYSTECTOMY    . HIP RESURFACING    . LAPAROSCOPIC GASTRIC BANDING    . TUBAL LIGATION    . UVULOPALATOPHARYNGOPLASTY      Past Medical History:  Diagnosis Date  . Anxiety   . Bipolar 1 disorder (HCC)   . GERD (gastroesophageal reflux disease)   . Hypertension   . Hypothyroid   . OSA (obstructive sleep apnea)   . Thyroid disease     Electrocardiogram:  02/04/14  SR rate 63 poor R wave progression  03/30/14  SR rate 82  Normal   Assessment and Plan  Dyspnea: functional from being sedentary and overweight will recheck echo and r/o anginal equivalent With lexiscan myovue F/U with primary and make sure BNP/CXR D dimer done Obesity would benefit from bariatric referral HTN Well controlled.  Continue current medications and low sodium Dash type diet.   Bipolar seems stable on meds  Thyroid continue replacement labs with primary  Edema:  May actually be lymphedema she indicates normal venous duplex in past Consider referral to renal to find diuretic that won't make her hyponatremic    Sharon Mckee

## 2017-01-10 ENCOUNTER — Ambulatory Visit: Payer: BLUE CROSS/BLUE SHIELD | Admitting: Cardiovascular Disease

## 2017-01-10 ENCOUNTER — Encounter: Payer: Self-pay | Admitting: Cardiovascular Disease

## 2017-01-10 VITALS — BP 138/68 | HR 74 | Ht 62.0 in | Wt 259.5 lb

## 2017-01-10 DIAGNOSIS — R0600 Dyspnea, unspecified: Secondary | ICD-10-CM | POA: Diagnosis not present

## 2017-01-10 NOTE — Patient Instructions (Addendum)
Medication Instructions:  Your physician recommends that you continue on your current medications as directed. Please refer to the Current Medication list given to you today.  Labwork: NONE  Testing/Procedures: Your physician has requested that you have a lexiscan myoview. For further information please visit [email or web address]. Please follow instruction sheet, as given.  Your physician has requested that you have an echocardiogram. Echocardiography is a painless test that uses sound waves to create images of your heart. It provides your doctor with information about the size and shape of your heart and how well your heart's chambers and valves are working. This procedure takes approximately one hour. There are no restrictions for this procedure.  Follow-Up: Your physician wants you to follow-up as needed with Dr. Nishan.  If you need a refill on your cardiac medications before your next appointment, please call your pharmacy.    

## 2017-01-28 ENCOUNTER — Telehealth (HOSPITAL_COMMUNITY): Payer: Self-pay | Admitting: *Deleted

## 2017-01-28 NOTE — Telephone Encounter (Signed)
Patient given detailed instructions per Myocardial Perfusion Study Information Sheet for the test on 01/31/17 at 1230. Patient notified to arrive 15 minutes early and that it is imperative to arrive on time for appointment to keep from having the test rescheduled.  If you need to cancel or reschedule your appointment, please call the office within 24 hours of your appointment. . Patient verbalized understanding.Freddye Cardamone, Adelene IdlerCynthia W

## 2017-01-31 ENCOUNTER — Ambulatory Visit (HOSPITAL_BASED_OUTPATIENT_CLINIC_OR_DEPARTMENT_OTHER): Payer: BLUE CROSS/BLUE SHIELD

## 2017-01-31 ENCOUNTER — Other Ambulatory Visit: Payer: Self-pay

## 2017-01-31 ENCOUNTER — Ambulatory Visit (HOSPITAL_COMMUNITY): Payer: BLUE CROSS/BLUE SHIELD | Attending: Internal Medicine

## 2017-01-31 DIAGNOSIS — G4733 Obstructive sleep apnea (adult) (pediatric): Secondary | ICD-10-CM | POA: Diagnosis not present

## 2017-01-31 DIAGNOSIS — E039 Hypothyroidism, unspecified: Secondary | ICD-10-CM | POA: Diagnosis not present

## 2017-01-31 DIAGNOSIS — F419 Anxiety disorder, unspecified: Secondary | ICD-10-CM | POA: Diagnosis not present

## 2017-01-31 DIAGNOSIS — R0600 Dyspnea, unspecified: Secondary | ICD-10-CM

## 2017-01-31 DIAGNOSIS — Z6841 Body Mass Index (BMI) 40.0 and over, adult: Secondary | ICD-10-CM | POA: Insufficient documentation

## 2017-01-31 DIAGNOSIS — E669 Obesity, unspecified: Secondary | ICD-10-CM | POA: Insufficient documentation

## 2017-01-31 DIAGNOSIS — I10 Essential (primary) hypertension: Secondary | ICD-10-CM | POA: Insufficient documentation

## 2017-01-31 MED ORDER — REGADENOSON 0.4 MG/5ML IV SOLN
0.4000 mg | Freq: Once | INTRAVENOUS | Status: AC
  Administered 2017-01-31: 0.4 mg via INTRAVENOUS

## 2017-01-31 MED ORDER — PERFLUTREN LIPID MICROSPHERE
1.0000 mL | INTRAVENOUS | Status: AC | PRN
  Administered 2017-01-31: 2 mL via INTRAVENOUS

## 2017-01-31 MED ORDER — TECHNETIUM TC 99M TETROFOSMIN IV KIT
32.3000 | PACK | Freq: Once | INTRAVENOUS | Status: AC | PRN
  Administered 2017-01-31: 32.3 via INTRAVENOUS
  Filled 2017-01-31: qty 33

## 2017-02-01 ENCOUNTER — Ambulatory Visit (HOSPITAL_COMMUNITY): Payer: BLUE CROSS/BLUE SHIELD | Attending: Cardiology

## 2017-02-01 LAB — MYOCARDIAL PERFUSION IMAGING
CSEPPHR: 90 {beats}/min
LHR: 0.28
LV dias vol: 131 mL (ref 46–106)
LV sys vol: 51 mL
NUC STRESS TID: 0.95
Rest HR: 67 {beats}/min
SDS: 2
SRS: 1
SSS: 3

## 2017-02-01 MED ORDER — TECHNETIUM TC 99M TETROFOSMIN IV KIT
31.2000 | PACK | Freq: Once | INTRAVENOUS | Status: AC | PRN
  Administered 2017-02-01: 31.2 via INTRAVENOUS
  Filled 2017-02-01: qty 32

## 2017-07-03 ENCOUNTER — Encounter (HOSPITAL_BASED_OUTPATIENT_CLINIC_OR_DEPARTMENT_OTHER): Payer: Self-pay

## 2017-07-03 ENCOUNTER — Observation Stay (HOSPITAL_BASED_OUTPATIENT_CLINIC_OR_DEPARTMENT_OTHER)
Admission: EM | Admit: 2017-07-03 | Discharge: 2017-07-04 | Disposition: A | Discharge status: Home / Self Care | Payer: BLUE CROSS/BLUE SHIELD | Attending: Family Medicine | Admitting: Family Medicine

## 2017-07-03 ENCOUNTER — Emergency Department (HOSPITAL_BASED_OUTPATIENT_CLINIC_OR_DEPARTMENT_OTHER): Payer: BLUE CROSS/BLUE SHIELD

## 2017-07-03 ENCOUNTER — Other Ambulatory Visit: Payer: Self-pay

## 2017-07-03 DIAGNOSIS — I5032 Chronic diastolic (congestive) heart failure: Secondary | ICD-10-CM | POA: Diagnosis present

## 2017-07-03 DIAGNOSIS — R079 Chest pain, unspecified: Secondary | ICD-10-CM | POA: Diagnosis not present

## 2017-07-03 DIAGNOSIS — Z79899 Other long term (current) drug therapy: Secondary | ICD-10-CM | POA: Insufficient documentation

## 2017-07-03 DIAGNOSIS — F419 Anxiety disorder, unspecified: Secondary | ICD-10-CM

## 2017-07-03 DIAGNOSIS — Z87891 Personal history of nicotine dependence: Secondary | ICD-10-CM | POA: Insufficient documentation

## 2017-07-03 DIAGNOSIS — E039 Hypothyroidism, unspecified: Secondary | ICD-10-CM | POA: Insufficient documentation

## 2017-07-03 DIAGNOSIS — R0789 Other chest pain: Principal | ICD-10-CM | POA: Insufficient documentation

## 2017-07-03 DIAGNOSIS — F329 Major depressive disorder, single episode, unspecified: Secondary | ICD-10-CM | POA: Diagnosis present

## 2017-07-03 DIAGNOSIS — N179 Acute kidney failure, unspecified: Secondary | ICD-10-CM | POA: Diagnosis present

## 2017-07-03 DIAGNOSIS — F32A Depression, unspecified: Secondary | ICD-10-CM | POA: Diagnosis present

## 2017-07-03 DIAGNOSIS — I10 Essential (primary) hypertension: Secondary | ICD-10-CM | POA: Insufficient documentation

## 2017-07-03 DIAGNOSIS — F319 Bipolar disorder, unspecified: Secondary | ICD-10-CM | POA: Diagnosis present

## 2017-07-03 HISTORY — DX: Hypo-osmolality and hyponatremia: E87.1

## 2017-07-03 HISTORY — DX: Morbid (severe) obesity due to excess calories: E66.01

## 2017-07-03 HISTORY — DX: Edema, unspecified: R60.9

## 2017-07-03 HISTORY — DX: Personal history of other medical treatment: Z92.89

## 2017-07-03 LAB — CBC
HEMATOCRIT: 36.9 % (ref 36.0–46.0)
Hemoglobin: 12.8 g/dL (ref 12.0–15.0)
MCH: 32.7 pg (ref 26.0–34.0)
MCHC: 34.7 g/dL (ref 30.0–36.0)
MCV: 94.4 fL (ref 78.0–100.0)
Platelets: 237 10*3/uL (ref 150–400)
RBC: 3.91 MIL/uL (ref 3.87–5.11)
RDW: 13.6 % (ref 11.5–15.5)
WBC: 5.1 10*3/uL (ref 4.0–10.5)

## 2017-07-03 LAB — TROPONIN I: Troponin I: <0.03 ng/mL (ref <0.03)

## 2017-07-03 LAB — BASIC METABOLIC PANEL
Anion gap: 10 (ref 5–15)
BUN: 22 mg/dL — AB (ref 6–20)
CHLORIDE: 102 mmol/L (ref 101–111)
CO2: 20 mmol/L — ABNORMAL LOW (ref 22–32)
Calcium: 9.7 mg/dL (ref 8.9–10.3)
Creatinine, Ser: 1.71 mg/dL — ABNORMAL HIGH (ref 0.44–1.00)
GFR calc Af Amer: 35 mL/min — ABNORMAL LOW (ref >60)
GFR calc non Af Amer: 30 mL/min — ABNORMAL LOW (ref >60)
GLUCOSE: 108 mg/dL — AB (ref 65–99)
POTASSIUM: 4.1 mmol/L (ref 3.5–5.1)
Sodium: 132 mmol/L — ABNORMAL LOW (ref 135–145)

## 2017-07-03 MED ORDER — NITROGLYCERIN 0.4 MG SL SUBL: 0.4000 mg | SUBLINGUAL_TABLET | SUBLINGUAL | Status: DC | PRN

## 2017-07-03 MED ORDER — SODIUM CHLORIDE 0.9 % IV SOLN
INTRAVENOUS | Status: AC
  Administered 2017-07-04: 01:00:00 via INTRAVENOUS

## 2017-07-03 MED ORDER — ASPIRIN 81 MG PO CHEW
324.0000 mg | CHEWABLE_TABLET | Freq: Once | ORAL | Status: AC
  Administered 2017-07-03: 324 mg via ORAL
  Filled 2017-07-03: qty 4

## 2017-07-03 MED ORDER — LEVOTHYROXINE SODIUM 100 MCG PO TABS
100.0000 ug | ORAL_TABLET | Freq: Every day | ORAL | Status: DC
  Filled 2017-07-03: qty 1

## 2017-07-03 MED ORDER — OMEGA-3-ACID ETHYL ESTERS 1 G PO CAPS
1000.0000 mg | ORAL_CAPSULE | Freq: Every day | ORAL | Status: DC
  Administered 2017-07-04: 1000 mg via ORAL
  Filled 2017-07-03: qty 1

## 2017-07-03 MED ORDER — LURASIDONE HCL 40 MG PO TABS
40.0000 mg | ORAL_TABLET | Freq: Every day | ORAL | Status: DC
  Administered 2017-07-04: 40 mg via ORAL
  Filled 2017-07-03: qty 1

## 2017-07-03 MED ORDER — MODAFINIL 100 MG PO TABS
200.0000 mg | ORAL_TABLET | Freq: Every day | ORAL | Status: DC
  Filled 2017-07-03: qty 2

## 2017-07-03 MED ORDER — SODIUM CHLORIDE 0.9 % IV BOLUS
500.0000 mL | Freq: Once | INTRAVENOUS | Status: AC
  Administered 2017-07-03: 500 mL via INTRAVENOUS

## 2017-07-03 MED ORDER — MAGNESIUM OXIDE 400 (241.3 MG) MG PO TABS
200.0000 mg | ORAL_TABLET | Freq: Every day | ORAL | Status: DC
  Administered 2017-07-04: 200 mg via ORAL
  Filled 2017-07-03: qty 1

## 2017-07-03 MED ORDER — HYDRALAZINE HCL 20 MG/ML IJ SOLN: 5.0000 mg | INTRAMUSCULAR | Status: DC | PRN

## 2017-07-03 MED ORDER — ALPRAZOLAM 0.5 MG PO TABS
1.0000 mg | ORAL_TABLET | Freq: Three times a day (TID) | ORAL | Status: DC | PRN
  Administered 2017-07-04: 1 mg via ORAL
  Filled 2017-07-03: qty 2

## 2017-07-03 MED ORDER — AMLODIPINE BESYLATE 5 MG PO TABS
5.0000 mg | ORAL_TABLET | Freq: Every day | ORAL | Status: DC
  Administered 2017-07-04: 5 mg via ORAL
  Filled 2017-07-03: qty 1

## 2017-07-03 MED ORDER — ONDANSETRON HCL 4 MG/2ML IJ SOLN: 4.0000 mg | Freq: Four times a day (QID) | INTRAMUSCULAR | Status: DC | PRN

## 2017-07-03 MED ORDER — ASPIRIN 81 MG PO CHEW
324.0000 mg | CHEWABLE_TABLET | Freq: Once | ORAL | Status: AC
  Administered 2017-07-04: 324 mg via ORAL
  Filled 2017-07-03: qty 4

## 2017-07-03 MED ORDER — ARIPIPRAZOLE 5 MG PO TABS
30.0000 mg | ORAL_TABLET | Freq: Every day | ORAL | Status: DC
  Filled 2017-07-03: qty 6

## 2017-07-03 MED ORDER — BIOTIN 1 MG PO CAPS: 1000.0000 mg | ORAL_CAPSULE | Freq: Every day | ORAL | Status: DC

## 2017-07-03 MED ORDER — SODIUM CHLORIDE 0.9 % IV SOLN: INTRAVENOUS | Status: DC

## 2017-07-03 MED ORDER — LEVOTHYROXINE SODIUM 25 MCG PO TABS: 125.0000 ug | ORAL_TABLET | Freq: Every day | ORAL | Status: DC

## 2017-07-03 MED ORDER — MORPHINE SULFATE (PF) 2 MG/ML IV SOLN: 2.0000 mg | INTRAVENOUS | Status: DC | PRN

## 2017-07-03 MED ORDER — MODAFINIL 200 MG PO TABS: 200.0000 mg | ORAL_TABLET | Freq: Every day | ORAL | Status: DC

## 2017-07-03 MED ORDER — ZOLPIDEM TARTRATE 5 MG PO TABS: 5.0000 mg | ORAL_TABLET | Freq: Every evening | ORAL | Status: DC | PRN

## 2017-07-03 MED ORDER — LAMOTRIGINE 100 MG PO TABS
200.0000 mg | ORAL_TABLET | Freq: Every day | ORAL | Status: DC
  Administered 2017-07-04: 200 mg via ORAL
  Filled 2017-07-03: qty 2

## 2017-07-03 MED ORDER — ENOXAPARIN SODIUM 40 MG/0.4ML ~~LOC~~ SOLN
40.0000 mg | SUBCUTANEOUS | Status: DC
  Administered 2017-07-04: 40 mg via SUBCUTANEOUS
  Filled 2017-07-03: qty 0.4

## 2017-07-03 MED ORDER — ACETAMINOPHEN 325 MG PO TABS
650.0000 mg | ORAL_TABLET | ORAL | Status: DC | PRN
  Administered 2017-07-04: 650 mg via ORAL
  Filled 2017-07-03: qty 2

## 2017-07-03 MED ORDER — ARMODAFINIL 250 MG PO TABS: 250.0000 mg | ORAL_TABLET | Freq: Every day | ORAL | Status: DC

## 2017-07-03 MED ORDER — AMPHETAMINE-DEXTROAMPHET ER 10 MG PO CP24: 20.0000 mg | ORAL_CAPSULE | ORAL | Status: DC

## 2017-07-03 NOTE — ED Provider Notes (Signed)
[facility] EMERGENCY DEPARTMENT Provider Note   CSN: [id number] Arrival date & time: 07/03/17  1500     History   Chief Complaint Chief Complaint  Patient presents with  . Chest Pain    HPI SHELBEE APGAR is a 67 y.o. female with a history of hypertension, hypothyroidism, GERD who presents the emergency department today for chest pain.  Patient states that she was at work today while sitting down she started developing the sensation of nausea.  Shortly after that she started having left sided chest pain without radiation to her back, abdomen, neck, jaw or either shoulder.  She notes that her pain has waxed and waned since onset.  She notes that is not worse deep breathing, or positional changes.  She does have shortness of breath with exertion and is unsure if the pain is worsened with exertion. The patient has denies any emesis or diaphoresis. She states this feels different than her GERD and denies any burping or belching.  She has not taken anything for her symptoms.  She has prior nuclear stress tests including on 02/01/2017 which showed EF of 61% with no wall motion abnormalities and was considered a low risk study.  Her last echocardiogram was on 01/31/2017 which showed a EF of 60 to 65% with no wall motion abnormalities.  She did have G1DD.  She denies any fever, chills, cough.  The patient's chart it appears that she had the stress test and echocardiogram secondary to chronic dyspnea and lower extremity swelling.  These were both reassuring.  She states she cannot tolerate Lasix and has now been placed on spironolactone of the last 3 months with relief of her lower extremity swelling. Patient is a former smoker. Notes family history of cardiac disease including her father.   HPI  Past Medical History:  Diagnosis Date  . Anxiety   . Bipolar 1 disorder (HCC)   . GERD (gastroesophageal reflux disease)   . Hypertension   . Hypothyroid   . OSA (obstructive sleep apnea)   .  Thyroid disease     Patient Active Problem List   Diagnosis Date Noted  . Dyspnea 03/30/2014  . Depression 03/04/2014  . Hemorrhage of rectum 03/04/2014  . Diverticulosis 03/04/2014  . 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 Edema 12/11/2012  . Lapband APS Earle Dec 2008 11/14/2011  . PULMONARY NODULE, RIGHT LOWER LOBE 01/31/2008  . BACTERIAL PNEUMONIA 01/26/2008  . HYPERTHYROIDISM 01/21/2008  . SLEEP APNEA 01/21/2008    Past Surgical History:  Procedure Laterality Date  . ABDOMINAL HYSTERECTOMY    . APPENDECTOMY    . CHOLECYSTECTOMY    . HIP RESURFACING    . LAPAROSCOPIC GASTRIC BANDING    . TUBAL LIGATION    . UVULOPALATOPHARYNGOPLASTY       OB History   None      Home Medications    Prior to Admission medications   Medication Sig Start Date End Date Taking? Authorizing Provider  ALPRAZolam Prudy Feeler) 1 MG tablet Take by mouth 3 (three) times daily as needed for anxiety.  10/31/11   [provider]  amLODipine (NORVASC) 5 MG tablet Take 1 tablet by mouth daily. 12/04/12   [provider]  amphetamine-dextroamphetamine (ADDERALL XR) 25 MG 24 hr capsule Take 25 mg by mouth every morning.    [provider]  ARIPiprazole (ABILIFY) 30 MG tablet Take 30 mg daily by mouth. 12/07/16   [provider]  Armodafinil (NUVIGIL) 250 MG tablet Take 250 mg  by mouth daily.    [provider]  Biotin 1 MG CAPS Take 1,000 mg daily by mouth.    [provider]  celecoxib (CELEBREX) 200 MG capsule Take 200 mg daily by mouth. 12/26/16   [provider]  DIOVAN 320 MG tablet Take 320 mg by mouth daily.  09/30/11   [provider]  DOCOSAHEXAENOIC ACID PO Take 1 g daily by mouth.    [provider]  lamoTRIgine (LAMICTAL) 200 MG tablet Take 200 mg by mouth daily.    [provider]  levothyroxine (SYNTHROID, LEVOTHROID) 100 MCG tablet Take 100 mcg daily before breakfast by mouth.    [provider]    levothyroxine (SYNTHROID, LEVOTHROID) 125 MCG tablet Take 125 mcg daily by mouth. 06/01/16   [provider]  Magnesium 100 MG CAPS Take daily by mouth.    [provider]    Family History Family History  Problem Relation Age of Onset  . Hypertension Father   . Heart disease Father   . Cancer Sister   . Peripheral vascular disease Sister   . Hypertension Brother     Social History Social History   Tobacco Use  . Smoking status: Former Smoker    Last attempt to quit: 12/12/2007    Years since quitting: 9.5  . Smokeless tobacco: Never Used  Substance Use Topics  . Alcohol use: Yes    Comment: rare  . Drug use: No     Allergies   Patient has no known allergies.   Review of Systems Review of Systems  All other systems reviewed and are negative.    Physical Exam Updated Vital Signs BP (!) 156/78 (BP Location: Left Arm)   Pulse 80   Temp 98.3 F (36.8 C) (Oral)   Resp 20   Ht  (1.549 m)   Wt 117.3 kg (258 lb 9.6 oz)   SpO2 100%   BMI 48.86 kg/m   Physical Exam  Constitutional: She appears well-developed and well-nourished.  HENT:  Head: Normocephalic and atraumatic.  Right Ear: External ear normal.  Left Ear: External ear normal.  Nose: Nose normal.  Mouth/Throat: Uvula is midline, oropharynx is clear and moist and mucous membranes are normal. No tonsillar exudate.  Eyes: Pupils are equal, round, and reactive to light. Right eye exhibits no discharge. Left eye exhibits no discharge. No scleral icterus.  Neck: Trachea normal. Neck supple. No JVD present. No spinous process tenderness present. Carotid bruit is not present. No neck rigidity. Normal range of motion present.  Cardiovascular: Normal rate, regular rhythm and intact distal pulses.  No murmur heard. Pulses:      Radial pulses are 2+ on the right side, and 2+ on the left side.       Dorsalis pedis pulses are 2+ on the right side, and 2+ on the left side.       Posterior  tibial pulses are 2+ on the right side, and 2+ on the left side.  No lower extremity swelling or edema. Calves symmetric in size bilaterally.  Pulmonary/Chest: Effort normal and breath sounds normal. She exhibits no tenderness.  No increased work of breathing. No accessory muscle use. Patient is sitting upright, speaking in full sentences without difficulty   Abdominal: Soft. Bowel sounds are normal. There is no tenderness. There is no rebound and no guarding.  Musculoskeletal: She exhibits no edema.  Lymphadenopathy:    She has no cervical adenopathy.  Neurological: She is alert.  Speech  clear. Follows commands. No facial droop. PERRLA. EOM grossly intact. CN III-XII grossly intact. Grossly moves all extremities 4 without ataxia. Able and appropriate strength for age to upper and lower extremities bilaterally  Skin: Skin is warm and dry. No rash noted. She is not diaphoretic.  Psychiatric: She has a normal mood and affect.  Nursing note and vitals reviewed.    ED Treatments / Results  Labs (all labs ordered are listed, but only abnormal results are displayed) Labs Reviewed  BASIC METABOLIC PANEL - Abnormal; Notable for the following components:      Result Value   Sodium 132 (*)    CO2 20 (*)    Glucose, Bld 108 (*)    BUN 22 (*)    Creatinine, Ser 1.71 (*)    GFR calc non Af Amer 30 (*)    GFR calc Af Amer 35 (*)    All other components within normal limits  CBC  TROPONIN I    EKG EKG Interpretation  Date/Time:  Wednesday Jul 03 2017 15:09:38 EDT Ventricular Rate:  77 PR Interval:  202 QRS Duration: 100 QT Interval:  360 QTC Calculation: 407 R Axis:   -7 Text Interpretation:  Normal sinus rhythm nonspecific ST changes similar to Sept 2008 Confirmed by Pricilla Loveless [phone]) on 07/03/2017 3:13:12 PM   EKG Interpretation  Date/Time:  Wednesday Jul 03 2017 17:49:41 EDT Ventricular Rate:  66 PR Interval:  202 QRS Duration: 112 QT Interval:  402 QTC  Calculation: 422 R Axis:   6 Text Interpretation:  Sinus rhythm Borderline prolonged PR interval Borderline intraventricular conduction delay Low voltage, precordial leads no significant change compared to earlier inthe day Confirmed by Pricilla Loveless [phone]) on 07/03/2017 5:53:05 PM       Radiology Dg Chest 2 View  Result Date: 07/03/2017 CLINICAL DATA:  Left-sided chest pain and nausea for 1 day. EXAM: CHEST - 2 VIEW COMPARISON:  10/08/2013 FINDINGS: The heart size and mediastinal contours are within normal limits. Both lungs are clear. The visualized skeletal structures are unremarkable. IMPRESSION: Stable exam.  No active cardiopulmonary disease. Electronically Signed   By: Myles Rosenthal M.D.   On: 07/03/2017 15:31    Procedures Procedures (including critical care time)  Medications Ordered in ED Medications  nitroGLYCERIN (NITROSTAT) SL tablet 0.4 mg (has no administration in time range)  aspirin chewable tablet 324 mg (324 mg Oral Given 07/03/17 1921)  sodium chloride 0.9 % bolus 500 mL (500 mLs Intravenous New Bag/Given 07/03/17 1912)     Initial Impression / Assessment and Plan / ED Course  I have reviewed the triage vital signs and the nursing notes.  Pertinent labs & imaging results that were available during my care of the patient were reviewed by me and considered in my medical decision making (see chart for details).      67 y.o. female with a history of hypertension resenting to the emergency department today for left-sided chest pain with associated nausea and shortness of breath that is mildly worsened by exertion. She has prior nuclear stress tests including on 02/01/2017 which showed EF of 61% with no wall motion abnormalities and was considered a low risk study.  Her last echocardiogram was on 01/31/2017 which showed a EF of 60 to 65% with no wall motion abnormalities.  She did have G1DD.  Patient notes that she has a history of chronic dyspnea as well as peripheral edema  for which she was placed on spironolactone the last 3 months with  relief of her lower extremity swelling.  Vital signs are reassuring on presentation.  No tachycardia, tachypnea or hypoxia.  Patient without any focal neurologic deficits.  Trachea midline.  Heart regular rate and rhythm without any murmurs auscultated.  Lung sounds equal bilaterally.  Patient with nonspecific ST changes on EKG.  No STEMI.  Troponin less than 0.03.  Patient is noted to have an acute kidney injury.  Chart reviewed patient's last creatinine was on 10/08/2016 with a creatinine of 0.74.  It is unknown the cause of this however the patient was recently started on spironolactone 3 months ago.  Will give the patient fluids.  Labs otherwise reassuring.  Patient had a recurrence of her chest pain while in the department.  Patient given aspirin.  Nitroglycerin was ordered but chest pain resolved prior to administration.  Repeat EKG was reassuring.  Patient heart score places her at moderate risk.  Feel the patient will need to be admitted for chest pain rule out as well as new acute kidney injury.  I appreciate Dr. Margo Aye for bringing the patient into the hospital.  She is in agreement with plan and appears stable for admission.  Patient case seen and discussed with Dr. Criss Alvine who is in agreement with plan.   Final Clinical Impressions(s) / ED Diagnoses   Final diagnoses:  Chest pain in adult  AKI (acute kidney injury) [facility])    ED Discharge Orders    None       Princella Pellegrini 07/03/17 2015    Pricilla Loveless, MD 07/03/17 2342

## 2017-07-03 NOTE — ED Triage Notes (Signed)
C/o CP x 3 hours-NAD-steady gait 

## 2017-07-03 NOTE — ED Notes (Signed)
Attempted to insert an IV, unsuccessful x 1.

## 2017-07-03 NOTE — ED Notes (Signed)
ED Provider at bedside. 

## 2017-07-03 NOTE — Plan of Care (Signed)
  Problem: Education: Goal: Knowledge of General Education information will improve Outcome: Completed/Met

## 2017-07-03 NOTE — H&P (Signed)
History and Physical    Sharon Mckee ZOX:[id number] DOB: 1950-07-12 DOA: 07/03/2017  Referring MD/NP/PA:   PCP: Mila Palmer, MD   Patient coming from:  The patient is coming from home.  At baseline, pt is independent for most of ADL.   Chief Complaint: chest pain  HPI: Sharon Mckee is a 67 y.o. female with medical history significant of hypertension, GERD, hypothyroidism, depression with anxiety, bipolar disorder, OSA not on CPAP, dCHF, who presents with chest pain.  Patient states that her chest pain started yesterday, which is located in substernal area, constant, 4 out of 10 severity, pressure-like, nonradiating.  Patient states that she has chronic mild shortness of breath, which has not changed today.  She does not have cough, fever or chills.  She has nausea, no vomiting, diarrhea or abdominal pain.  Patient is constipated recently.  Patient does not have symptoms of UTI or unilateral weakness.  ED Course: pt was found to have negative troponin, BNP 26.5, acute renal injury with creatinine 1.71, temperature normal, soft blood pressure, no tachycardia, oxygen saturation 100% on room air, negative chest x-ray.  Patient is placed on telemetry bed for observation  Review of Systems:   General: no fevers, chills, has fatigue HEENT: no blurry vision, hearing changes or sore throat Respiratory: has dyspnea, no coughing, wheezing CV: has chest pain, no palpitations GI: has nausea, no vomiting, abdominal pain, diarrhea, has constipation GU: no dysuria, burning on urination, increased urinary frequency, hematuria  Ext: has leg edema Neuro: no unilateral weakness, numbness, or tingling, no vision change or hearing loss Skin: no rash, no skin tear. MSK: No muscle spasm, no deformity, no limitation of range of movement in spin Heme: No easy bruising.  Travel history: No recent long distant travel.  Allergy: No Known Allergies  Past Medical History:  Diagnosis Date  . Anxiety   .  Bipolar 1 disorder (HCC)   . GERD (gastroesophageal reflux disease)   . Hypertension   . Hypothyroid   . OSA (obstructive sleep apnea)   . Thyroid disease     Past Surgical History:  Procedure Laterality Date  . ABDOMINAL HYSTERECTOMY    . APPENDECTOMY    . CHOLECYSTECTOMY    . HIP RESURFACING    . LAPAROSCOPIC GASTRIC BANDING    . TUBAL LIGATION    . UVULOPALATOPHARYNGOPLASTY      Social History:  reports that she quit smoking about 9 years ago. She has never used smokeless tobacco. She reports that she drinks alcohol. She reports that she does not use drugs.  Family History:  Family History  Problem Relation Age of Onset  . Hypertension Father   . Heart disease Father   . Cancer Sister   . Peripheral vascular disease Sister   . Hypertension Brother      Prior to Admission medications   Medication Sig Start Date End Date Taking? Authorizing Provider  ALPRAZolam Prudy Feeler) 1 MG tablet Take by mouth 3 (three) times daily as needed for anxiety.  10/31/11  Yes [provider]  amLODipine (NORVASC) 5 MG tablet Take 1 tablet by mouth daily. 12/04/12  Yes [provider]  amphetamine-dextroamphetamine (ADDERALL XR) 25 MG 24 hr capsule Take 25 mg by mouth every morning.   Yes [provider]  Armodafinil (NUVIGIL) 250 MG tablet Take 250 mg by mouth daily.   Yes [provider]  Biotin 1 MG CAPS Take 1,000 mg daily by mouth.   Yes [provider]  Lindwood Coke  320 MG tablet Take 320 mg by mouth daily.  09/30/11  Yes [provider]  lamoTRIgine (LAMICTAL) 200 MG tablet Take 200 mg by mouth daily.   Yes [provider]  levothyroxine (SYNTHROID, LEVOTHROID) 100 MCG tablet Take 100 mcg daily before breakfast by mouth.   Yes [provider]  levothyroxine (SYNTHROID, LEVOTHROID) 125 MCG tablet Take 125 mcg daily by mouth. 06/01/16  Yes [provider]  lurasidone (LATUDA) 40 MG TABS tablet Take 40 mg by mouth at  bedtime.   Yes [provider]  Magnesium 100 MG CAPS Take daily by mouth.   Yes [provider]  ARIPiprazole (ABILIFY) 30 MG tablet Take 30 mg daily by mouth. 12/07/16   [provider]  celecoxib (CELEBREX) 200 MG capsule Take 200 mg daily by mouth. 12/26/16   [provider]  DOCOSAHEXAENOIC ACID PO Take 1 g daily by mouth.    [provider]    Physical Exam: Vitals:   07/03/17 2100 07/03/17 2130 07/03/17 2248 07/04/17 0357  BP: (!) 114/52 (!) 101/50 119/68 (!) 104/59  Pulse: 69 71 65 60  Resp: Temp:   98.6 F (37 C) 98.4 F (36.9 C)  TempSrc:   Oral Oral  SpO2: 100% 99% 100% 98%  Weight:   116.5 kg (256 lb 14.4 oz) 116.2 kg (256 lb 1.3 oz)  Height:   5' 1.5" (1.562 m)    General: Not in acute distress HEENT:       Eyes: PERRL, EOMI, no scleral icterus.       ENT: No discharge from the ears and nose, no pharynx injection, no tonsillar enlargement.        Neck: No JVD, no bruit, no mass felt. Heme: No neck lymph node enlargement. Cardiac: S1/S2, RRR, No murmurs, No gallops or rubs. Respiratory: No rales, wheezing, rhonchi or rubs. GI: Soft, nondistended, nontender, no rebound pain, no organomegaly, BS present. GU: No hematuria Ext: 1+ pitting leg edema bilaterally. 2+DP/PT pulse bilaterally. Musculoskeletal: No joint deformities, No joint redness or warmth, no limitation of ROM in spin. Skin: No rashes.  Neuro: Alert, oriented X3, cranial nerves II-XII grossly intact, moves all extremities normally. Psych: Patient is not psychotic, no suicidal or hemocidal ideation.  Labs on Admission: I have personally reviewed following labs and imaging studies  CBC: Recent Labs  Lab 07/03/17 1535  WBC 5.1  HGB 12.8  HCT 36.9  MCV 94.4  PLT 237   Basic Metabolic Panel: Recent Labs  Lab 07/03/17 1535  NA 132*  K 4.1  CL 102  CO2 20*  GLUCOSE 108*  BUN 22*  CREATININE 1.71*  CALCIUM 9.7   GFR: Estimated  Creatinine Clearance: 39.8 mL/min (A) (by C-G formula based on SCr of 1.71 mg/dL (H)). Liver Function Tests: No results for input(s): AST, ALT, ALKPHOS, BILITOT, PROT, ALBUMIN in the last 168 hours. No results for input(s): LIPASE, AMYLASE in the last 168 hours. No results for input(s): AMMONIA in the last 168 hours. Coagulation Profile: No results for input(s): INR, PROTIME in the last 168 hours. Cardiac Enzymes: Recent Labs  Lab 07/03/17 1535 07/04/17 0024  TROPONINI <0.03 <0.03   BNP (last 3 results) No results for input(s): PROBNP in the last 8760 hours. HbA1C: Recent Labs    07/04/17 0024  HGBA1C 5.3   CBG: No results for input(s): GLUCAP in the last 168 hours. Lipid Profile: No results for input(s): CHOL, HDL, LDLCALC, TRIG, CHOLHDL, LDLDIRECT in  the last 72 hours. Thyroid Function Tests: Recent Labs    07/04/17 0024  TSH 1.397   Anemia Panel: No results for input(s): VITAMINB12, FOLATE, FERRITIN, TIBC, IRON, RETICCTPCT in the last 72 hours. Urine analysis: No results found for: COLORURINE, APPEARANCEUR, LABSPEC, PHURINE, GLUCOSEU, HGBUR, BILIRUBINUR, KETONESUR, PROTEINUR, UROBILINOGEN, NITRITE, LEUKOCYTESUR Sepsis Labs: (procalcitonin:4,lacticidven:4) )No results found for this or any previous visit (from the past 240 hour(s)).   Radiological Exams on Admission: Dg Chest 2 View  Result Date: 07/03/2017 CLINICAL DATA:  Left-sided chest pain and nausea for 1 day. EXAM: CHEST - 2 VIEW COMPARISON:  10/08/2013 FINDINGS: The heart size and mediastinal contours are within normal limits. Both lungs are clear. The visualized skeletal structures are unremarkable. IMPRESSION: Stable exam.  No active cardiopulmonary disease. Electronically Signed   By: Myles Rosenthal M.D.   On: 07/03/2017 15:31     EKG: Independently reviewed.  Sinus rhythm, QTC 422, poor R wave progression, low voltage, anteroseptal infarction pattern.   Assessment/Plan Principal Problem:   Chest  pain Active Problems:   Depression   Essential hypertension   Hypothyroidism   Bipolar 1 disorder (HCC)   Anxiety   AKI (acute kidney injury) (HCC)   Chronic diastolic CHF (congestive heart failure) (HCC)   Chest pain: Chest x-ray negative.  No signs of DVT, no worsening shortness of breath, less likely to have PE. - will place on Tele bed for obs - cycle CE q6 x3 and repeat EKG in the am  - prn Nitroglycerin, Morphine, and aspirin - Risk factor stratification: will check FLP, UDS and A1C  - 2d echo - inpt card consult was requested via Epic   Chronic diastolic CHF: Patient does not carry diagnosis of CHF, but her 2D echo on 11/209/18 showed EF 60-65% with grade 1 diastolic dysfunction.  Patient is taking spironolactone at home.  She has 1+ leg edema, but no pulmonary edema chest x-ray, BNP 26.5, CHF seems to be compensated. -Continue spironolactone  HTN:  -Continue home medications: Spironolactone, amlodipine -hold diovan due to AKI -IV hydralazine prn  Bipolar 1 disorder, anxiety and depression:  No SI or HI -continue PRN Xanax, Abilify, Lamictal, Latuda, Adderall,  Hypothyroidism: Last TSH was 17.7 on 01/13/08 -Continue home Synthroid -Check TSH  AKI: Likely due to prerenal secondary to dehydration and continuation of ARB, NSAIDs - IVF: NS 500 cc, then 75 cc/h for 6 hours - Follow up renal function by BMP - Check FeNa  - Hold Diovan   DVT ppx:  SQ Lovenox Code Status: Full code Family Communication: None at bed side.   Disposition Plan:  Anticipate discharge back to previous home environment Consults called:  none Admission status: Obs / tele    Date of Service 07/04/2017    Lorretta Harp [facility] Pager [phone]  If 7PM-7AM, please contact night-coverage [email or web address] Password [facility] 07/04/2017, 6:42 AM

## 2017-07-03 NOTE — Progress Notes (Signed)
Carryover: 67 year old female history of hypertension, obesity, family history of premature coronary disease, who presented to [facility] ED with complaints of chest pain.  Onset few hours ago, left-sided, no radiation, associated with dyspnea at rest and nausea.  In the ED first set of troponin negative, EKG with no ischemic changes.  Lab studies remarkable for elevated creatinine with suspicion for AKI.  Creatinine on presentation 1.71 from 0.7 in August 2018.  Admit to telemetry unit as observation status.  No charge.

## 2017-07-04 ENCOUNTER — Encounter (HOSPITAL_COMMUNITY): Payer: Self-pay | Admitting: Physician Assistant

## 2017-07-04 ENCOUNTER — Other Ambulatory Visit: Payer: Self-pay | Admitting: Physician Assistant

## 2017-07-04 ENCOUNTER — Other Ambulatory Visit (HOSPITAL_COMMUNITY): Payer: BLUE CROSS/BLUE SHIELD

## 2017-07-04 DIAGNOSIS — I10 Essential (primary) hypertension: Secondary | ICD-10-CM

## 2017-07-04 DIAGNOSIS — N179 Acute kidney failure, unspecified: Secondary | ICD-10-CM | POA: Diagnosis not present

## 2017-07-04 DIAGNOSIS — R079 Chest pain, unspecified: Secondary | ICD-10-CM | POA: Diagnosis not present

## 2017-07-04 DIAGNOSIS — R0789 Other chest pain: Secondary | ICD-10-CM | POA: Diagnosis not present

## 2017-07-04 DIAGNOSIS — R072 Precordial pain: Secondary | ICD-10-CM | POA: Diagnosis not present

## 2017-07-04 DIAGNOSIS — I5032 Chronic diastolic (congestive) heart failure: Secondary | ICD-10-CM | POA: Diagnosis present

## 2017-07-04 DIAGNOSIS — E039 Hypothyroidism, unspecified: Secondary | ICD-10-CM | POA: Diagnosis not present

## 2017-07-04 LAB — TSH: TSH: 1.397 u[IU]/mL (ref 0.350–4.500)

## 2017-07-04 LAB — CREATININE, SERUM
CREATININE: 1.71 mg/dL — AB (ref 0.44–1.00)
GFR calc Af Amer: 35 mL/min — ABNORMAL LOW (ref >60)
GFR calc non Af Amer: 30 mL/min — ABNORMAL LOW (ref >60)

## 2017-07-04 LAB — LIPID PANEL
Cholesterol: 141 mg/dL (ref 0–200)
HDL: 62 mg/dL (ref >40)
LDL CALC: 67 mg/dL (ref 0–99)
TRIGLYCERIDES: 60 mg/dL (ref <150)
Total CHOL/HDL Ratio: 2.3 RATIO
VLDL: 12 mg/dL (ref 0–40)

## 2017-07-04 LAB — RAPID URINE DRUG SCREEN, HOSP PERFORMED
AMPHETAMINES: POSITIVE — AB
Barbiturates: NOT DETECTED
Benzodiazepines: POSITIVE — AB
Cocaine: NOT DETECTED
OPIATES: NOT DETECTED
Tetrahydrocannabinol: POSITIVE — AB

## 2017-07-04 LAB — CREATININE, URINE, RANDOM: Creatinine, Urine: 163.95 mg/dL

## 2017-07-04 LAB — HIV ANTIBODY (ROUTINE TESTING W REFLEX): HIV Screen 4th Generation wRfx: NONREACTIVE

## 2017-07-04 LAB — TROPONIN I: Troponin I: <0.03 ng/mL (ref <0.03)

## 2017-07-04 LAB — SODIUM, URINE, RANDOM: Sodium, Ur: 35 mmol/L

## 2017-07-04 LAB — HEMOGLOBIN A1C
Hgb A1c MFr Bld: 5.3 % (ref 4.8–5.6)
Mean Plasma Glucose: 105.41 mg/dL

## 2017-07-04 LAB — BRAIN NATRIURETIC PEPTIDE: B NATRIURETIC PEPTIDE 5: 26.5 pg/mL (ref 0.0–100.0)

## 2017-07-04 MED ORDER — LEVOTHYROXINE SODIUM 100 MCG PO TABS: 100.0000 ug | ORAL_TABLET | Freq: Every day | ORAL | Status: DC

## 2017-07-04 MED ORDER — MELATONIN 3 MG PO TABS
3.0000 mg | ORAL_TABLET | Freq: Every day | ORAL | Status: DC
  Administered 2017-07-04: 3 mg via ORAL
  Filled 2017-07-04: qty 1

## 2017-07-04 MED ORDER — SPIRONOLACTONE 25 MG PO TABS
50.0000 mg | ORAL_TABLET | Freq: Every day | ORAL | Status: DC
  Administered 2017-07-04: 50 mg via ORAL
  Filled 2017-07-04: qty 2

## 2017-07-04 MED ORDER — LEVOTHYROXINE SODIUM 25 MCG PO TABS: 125.0000 ug | ORAL_TABLET | Freq: Every day | ORAL | Status: DC

## 2017-07-04 MED ORDER — BUPROPION HCL ER (XL) 150 MG PO TB24
150.0000 mg | ORAL_TABLET | Freq: Every day | ORAL | Status: DC
  Administered 2017-07-04: 150 mg via ORAL
  Filled 2017-07-04: qty 1

## 2017-07-04 MED ORDER — LEVOTHYROXINE SODIUM 75 MCG PO TABS
225.0000 ug | ORAL_TABLET | Freq: Every day | ORAL | Status: DC
  Administered 2017-07-04: 225 ug via ORAL
  Filled 2017-07-04: qty 3

## 2017-07-04 MED ORDER — POLYETHYLENE GLYCOL 3350 17 G PO PACK: 17.0000 g | PACK | Freq: Every day | ORAL | Status: DC | PRN

## 2017-07-04 NOTE — Progress Notes (Signed)
Discharge order received.  IV and telemetry removed.  Discharge instructions and packet reviewed with and given to patient.  Patient to be take to main enterance to wait on daughter per patient request.

## 2017-07-04 NOTE — Discharge Summary (Addendum)
Physician Discharge Summary  Sharon Mckee QMV:[id number] DOB: May 16, 1950 DOA: 07/03/2017  PCP: Mila Palmer, MD  Admit date: 07/03/2017 Discharge date: 07/04/2017  Admitted From: Home Disposition: Home  Recommendations for Outpatient Follow-up:  1. Follow up with PCP in 1 week 2. Please obtain BMP/CBC in 4 days 3. Outpatient dobutamine stress test with cardiology 4. Please follow up on the following pending results: None  Home Health: None Equipment/Devices: None  Discharge Condition: Stable CODE STATUS: Full code Diet recommendation: Heart healthy   Brief/Interim Summary:  Admission HPI written by Lorretta Harp, MD   Chief Complaint: chest pain  HPI: Sharon Mckee is a 67 y.o. female with medical history significant of hypertension, GERD, hypothyroidism, depression with anxiety, bipolar disorder, OSA not on CPAP, dCHF, who presents with chest pain.  Patient states that her chest pain started yesterday, which is located in substernal area, constant, 4 out of 10 severity, pressure-like, nonradiating.  Patient states that she has chronic mild shortness of breath, which has not changed today.  She does not have cough, fever or chills.  She has nausea, no vomiting, diarrhea or abdominal pain.  Patient is constipated recently.  Patient does not have symptoms of UTI or unilateral weakness.  ED Course: pt was found to have negative troponin, BNP 26.5, acute renal injury with creatinine 1.71, temperature normal, soft blood pressure, no tachycardia, oxygen saturation 100% on room air, negative chest x-ray.  Patient is placed on telemetry bed for observation    Hospital course:  Sharon Mckee was observed overnight.  Regards her chest pain, chest x-ray was negative, EKG was unchanged from previous and troponin was negative x3.  Fasting lipid panel, TSH, hemoglobin A1c were obtained and were normal.  Patient with a recent echo in November 2018 significant for grade 1 diastolic dysfunction  and normal EF.  No evidence of fluid overload.  Cardiology was consulted and recommended outpatient dobutamine stress test for follow-up.  On admission, she was also noted to have an acute kidney injury with a creatinine of 1.7 which is above her baseline of 0.8.  She had good urine output overnight.  Creatinine remained stable.  Patient instructed to continue with good oral hydration and will need repeat lab work in 4 days early next week.  Spironolactone and Diovan have been held on discharge secondary to an acute kidney injury.  Patient instructed to take a dose of Diovan if her blood pressure increases above 130/80 mmHg and to check her blood pressure daily.  Discharge Diagnoses:  Principal Problem:   Chest pain Active Problems:   Depression   Essential hypertension   Hypothyroidism   Bipolar 1 disorder (HCC)   Anxiety   AKI (acute kidney injury) (HCC)   Chronic diastolic CHF (congestive heart failure) [facility])    Discharge Instructions  Discharge Instructions    Call MD for:  difficulty breathing, headache or visual disturbances   Complete by:  As directed    Call MD for:  extreme fatigue   Complete by:  As directed    Call MD for:  severe uncontrolled pain   Complete by:  As directed    Diet - low sodium heart healthy   Complete by:  As directed    Increase activity slowly   Complete by:  As directed      Allergies as of 07/04/2017   No Known Allergies     Medication List    STOP taking these medications   DIOVAN 320 MG  tablet Generic drug:  valsartan   spironolactone 50 MG tablet Commonly known as:  ALDACTONE     TAKE these medications   albuterol 108 (90 Base) MCG/ACT inhaler Commonly known as:  PROVENTIL HFA;VENTOLIN HFA Inhale 1 puff into the lungs every 4 (four) hours as needed. Wheezing   ALPRAZolam 1 MG tablet Commonly known as:  XANAX Take by mouth 3 (three) times daily as needed for anxiety.   amLODipine 5 MG tablet Commonly known as:  NORVASC Take 1  tablet by mouth daily.   amphetamine-dextroamphetamine 30 MG 24 hr capsule Commonly known as:  ADDERALL XR Take 30 mg by mouth daily.   Biotin 1 MG Caps Take 1,000 mg daily by mouth.   buPROPion 150 MG 24 hr tablet Commonly known as:  WELLBUTRIN XL Take 150 mg by mouth daily.   FISH OIL PO Take 1 capsule by mouth daily.   lamoTRIgine 150 MG tablet Commonly known as:  LAMICTAL Take 150 mg by mouth daily.   LATUDA 60 MG Tabs Generic drug:  Lurasidone HCl Take 60 mg by mouth daily.   levothyroxine 100 MCG tablet Commonly known as:  SYNTHROID, LEVOTHROID Take 100 mcg by mouth daily before breakfast.   levothyroxine 125 MCG tablet Commonly known as:  SYNTHROID, LEVOTHROID Take 125 mcg daily by mouth.   Magnesium 100 MG Caps Take 100 mg by mouth daily.   NUVIGIL 250 MG tablet Generic drug:  Armodafinil Take 250 mg by mouth daily.   Vitamin D3 3000 units Tabs Take 3,000 Units by mouth daily.      Follow-up Information    Wendall Stade, MD Follow up.   Specialty:  Cardiology Why:  Our office will call you to schedule your stress echo and appointment. See end of this packet for instructions - nothing to eat or drink after midnight the night before, and do not take Adderall the day of the test. Contact information: [address] [place] [address] [phone]        Mila Palmer, MD. Schedule an appointment as soon as possible for a visit in 4 day(s).   Specialty:  Family Medicine Contact information: [address] [place] [address] [phone]          No Known Allergies  Consultations:  Cardiology   Procedures/Studies: Dg Chest 2 View  Result Date: 07/03/2017 CLINICAL DATA:  Left-sided chest pain and nausea for 1 day. EXAM: CHEST - 2 VIEW COMPARISON:  10/08/2013 FINDINGS: The heart size and mediastinal contours are within normal limits. Both lungs are clear. The visualized skeletal structures are  unremarkable. IMPRESSION: Stable exam.  No active cardiopulmonary disease. Electronically Signed   By: Myles Rosenthal M.D.   On: 07/03/2017 15:31     Subjective: No chest pain today.  Discharge Exam: Vitals:   07/04/17 0357 07/04/17 1017  BP: (!) 104/59 110/62  Pulse: 60   Resp: 16   Temp: 98.4 F (36.9 C)   SpO2: 98%    Vitals:   07/03/17 2130 07/03/17 2248 07/04/17 0357 07/04/17 1017  BP: (!) 101/50 119/68 (!) 104/59 110/62  Pulse: 71 65 60   Resp: Temp:  98.6 F (37 C) 98.4 F (36.9 C)   TempSrc:  Oral Oral   SpO2: 99% 100% 98%   Weight:  116.5 kg (256 lb 14.4 oz) 116.2 kg (256 lb 1.3 oz)   Height:  5' 1.5" (1.562 m)  General: Pt is alert, awake, not in acute distress Cardiovascular: RRR, S1/S2 +, no rubs, no gallops Respiratory: CTA bilaterally, no wheezing, no rhonchi Abdominal: Soft, NT, ND, bowel sounds + Extremities: no edema, no cyanosis    The results of significant diagnostics from this hospitalization (including imaging, microbiology, ancillary and laboratory) are listed below for reference.     Microbiology: No results found for this or any previous visit (from the past 240 hour(s)).   Labs: BNP (last 3 results) Recent Labs    07/04/17 0024  BNP 26.5   Basic Metabolic Panel: Recent Labs  Lab 07/03/17 1535 07/04/17 0632  NA 132*  --   K 4.1  --   CL 102  --   CO2 20*  --   GLUCOSE 108*  --   BUN 22*  --   CREATININE 1.71* 1.71*  CALCIUM 9.7  --    Liver Function Tests: No results for input(s): AST, ALT, ALKPHOS, BILITOT, PROT, ALBUMIN in the last 168 hours. No results for input(s): LIPASE, AMYLASE in the last 168 hours. No results for input(s): AMMONIA in the last 168 hours. CBC: Recent Labs  Lab 07/03/17 1535  WBC 5.1  HGB 12.8  HCT 36.9  MCV 94.4  PLT 237   Cardiac Enzymes: Recent Labs  Lab 07/03/17 1535 07/04/17 0024 07/04/17 0632  TROPONINI <0.03 <0.03 <0.03   BNP: Invalid input(s): POCBNP CBG: No  results for input(s): GLUCAP in the last 168 hours. D-Dimer No results for input(s): DDIMER in the last 72 hours. Hgb A1c Recent Labs    07/04/17 0024  HGBA1C 5.3   Lipid Profile Recent Labs    07/04/17 0632  CHOL 141  HDL 62  LDLCALC 67  TRIG 60  CHOLHDL 2.3   Thyroid function studies Recent Labs    07/04/17 0024  TSH 1.397     SIGNED:   Jacquelin Hawking, MD [facility] 07/04/2017, 1:53 PM

## 2017-07-04 NOTE — Progress Notes (Addendum)
I called schedulers to arrange dobutamine stress echo and the current scheduler is unable to handle the request and needs to review protocol with a supervisor who is currently in a meeting. They asked I route the request to the scheduling pool - I did so, requesting dobutamine stress echo and followup thereafter. Our office will call the patient with this information. I also reviewed test instructions with patient and attached instructions to her AVS. Per d/w MD she should not take any Adderall the day of the test. She does not need IP echo so I asked nurse to let echo dept know since the order is no longer actionable in Epic. Paged Dr. Caleb Popp to let him know our plan. From cardiac standpoint OK to go but will defer management of AKI to their team. Ronie Spies PA-C

## 2017-07-04 NOTE — Discharge Instructions (Signed)
Sharon Mckee,  During the hospital overnight work-up for your chest pain.  Does not appear to be related to your heart.  He also had an associated kidney injury.  This remained stable overnight.  I recommend holding his spironolactone and Diovan in the setting of acute kidney injury; please keep well-hydrated drinking plenty of fluids per day and having several episodes of urination.  Please follow-up with your primary care physician to get repeat lab work in 4 days.  If your blood pressure gets above 130/80, please take her Diovan.  Please check your blood pressure every day.  Please follow-up with the cardiologist for a pharmacologic stress test as an outpatient.

## 2017-07-04 NOTE — Consult Note (Addendum)
Cardiology Consultation:   Patient ID: Sharon Mckee; [id number]; Oct 04, 1950   Admit date: 07/03/2017 Date of Consult: 07/04/2017  Primary Care Provider: Mila Palmer, MD Primary Cardiologist: Dr. Eden Emms  Chief Complaint: chest pain, nausea  Patient Profile:   Sharon Mckee is a 67 y.o. female with a hx of Morbid obesity (lapband 2008 but gained most of weight back), chronic LE edema noncompliant with stockings (diuretic stopped in the past due to hyponatremia), 23 years former tobacco abuse, sleep apnea, HTN, bipolar disorder, anxiety, reflux, hyopthyroidism who is being seen today for the evaluation of chest pain at the request of Dr. Caleb Popp.  History of Present Illness:   She saw Dr. Eden Emms for chronic dyspnea (for several years) in 01/2017 at which time nuc was completely normal. 2D Echo 01/2017 showed EF 60-65%, grade 1 DD, normal CVP.  She was in her USOH yesterday and ate a cereal bar for breatkfast which was different than her normal yogurt and granola. Afterwards she felt nauseated. She attributed this to eating something different than usual. After an hour however she developed mild midsternal chest tightness radiating slightly to the left. No associated SOB, diaphoresis, vomiting. Symptoms would persist for a few minutes (max 15-20) then resolve spontaneously. Given persistence of symptoms, her family urged her to go to the ER - shes tates they were more concerned than she was. It is not worse with exertion, inspiration, palpation, movement. She did not try anything to make the pain better. It waxed/waned all day long until it disappeared spontaneously sometime during the night. She does not exercise. She walks about a 2 minute walk from car to work which she does get SOB with, but this is chronic for several years and unchanged. Aside from recent increased stress due to making less money at work, she denies any other new changes or symptoms. She is feeling good this morning.  Troponins are negative, BNP normal, Na 132, Cr 1.71, LDL 67, CBC wnl, A1C 5.3, UDS + THC, benzos, amphetamines (she is on Adderall and Xanax at home and admits to marijuana use). Cr 10/2016 was normal at 0.74. AKI is felt pre-renal secondary to dehydration and continuation of ARB/NSAIDs. She is being treated with IVF fluids and ARB is held.  Father had stroke. Sister had to have what sounds like a cardioversion for irregular rhythm. No fam hx of CAD.  Past Medical History:  Diagnosis Date  . Anxiety   . Bipolar 1 disorder (HCC)   . Chronic edema    a. diuretics stopped in the past due to hyponatremia.  Marland Kitchen GERD (gastroesophageal reflux disease)   . H/O cardiovascular stress test    a. 01/2017: normal.  . Hypertension   . Hyponatremia   . Hypothyroid   . Morbid obesity (HCC)    a. lapband 2008 but gained most of weight back.  . OSA (obstructive sleep apnea)   . Thyroid disease     Past Surgical History:  Procedure Laterality Date  . ABDOMINAL HYSTERECTOMY    . APPENDECTOMY    . CHOLECYSTECTOMY    . HIP RESURFACING    . LAPAROSCOPIC GASTRIC BANDING    . TUBAL LIGATION    . UVULOPALATOPHARYNGOPLASTY       Inpatient Medications: Scheduled Meds: . amLODipine  5 mg Oral Daily  . amphetamine-dextroamphetamine  20 mg Oral [id number]  . ARIPiprazole  30 mg Oral Daily  . aspirin  324 mg Oral Once  . buPROPion  150 mg Oral Daily  .  enoxaparin (LOVENOX) injection  40 mg Subcutaneous Q24H  . lamoTRIgine  200 mg Oral Daily  . levothyroxine  100 mcg Oral QAC breakfast   And  . levothyroxine  125 mcg Oral QAC breakfast  . lurasidone  40 mg Oral QHS  . magnesium oxide  200 mg Oral Daily  . Melatonin  3 mg Oral QHS  . modafinil  200 mg Oral Daily  . omega-3 acid ethyl esters  1,000 mg Oral Daily  . spironolactone  50 mg Oral Daily   Continuous Infusions:  PRN Meds: acetaminophen, ALPRAZolam, hydrALAZINE, morphine injection, nitroGLYCERIN, ondansetron (ZOFRAN) IV, polyethylene glycol,  zolpidem  Home Meds: Prior to Admission medications   Medication Sig Start Date End Date Taking? Authorizing Provider  ALPRAZolam Prudy Feeler) 1 MG tablet Take by mouth 3 (three) times daily as needed for anxiety.  10/31/11  Yes [provider]  amLODipine (NORVASC) 5 MG tablet Take 1 tablet by mouth daily. 12/04/12  Yes [provider]  amphetamine-dextroamphetamine (ADDERALL XR) 25 MG 24 hr capsule Take 25 mg by mouth every morning.   Yes [provider]  Armodafinil (NUVIGIL) 250 MG tablet Take 250 mg by mouth daily.   Yes [provider]  Biotin 1 MG CAPS Take 1,000 mg daily by mouth.   Yes [provider]  DIOVAN 320 MG tablet Take 320 mg by mouth daily.  09/30/11  Yes [provider]  lamoTRIgine (LAMICTAL) 200 MG tablet Take 200 mg by mouth daily.   Yes [provider]  levothyroxine (SYNTHROID, LEVOTHROID) 100 MCG tablet Take 100 mcg daily before breakfast by mouth.   Yes [provider]  levothyroxine (SYNTHROID, LEVOTHROID) 125 MCG tablet Take 125 mcg daily by mouth. 06/01/16  Yes [provider]  lurasidone (LATUDA) 40 MG TABS tablet Take 40 mg by mouth at bedtime.   Yes [provider]  Magnesium 100 MG CAPS Take daily by mouth.   Yes [provider]  ARIPiprazole (ABILIFY) 30 MG tablet Take 30 mg daily by mouth. 12/07/16   [provider]  celecoxib (CELEBREX) 200 MG capsule Take 200 mg daily by mouth. 12/26/16   [provider]  DOCOSAHEXAENOIC ACID PO Take 1 g daily by mouth.    [provider]    Allergies:   No Known Allergies  Social History:   Social History   Socioeconomic History  . Marital status: Single    Spouse name: Not on file  . Number of children: Not on file  . Years of education: Not on file  . Highest education level: Not on file  Occupational History  . Not on file  Social Needs  . Financial resource strain: Not on file  . Food  insecurity:    Worry: Not on file    Inability: Not on file  . Transportation needs:    Medical: Not on file    Non-medical: Not on file  Tobacco Use  . Smoking status: Former Smoker    Last attempt to quit: 12/12/2007    Years since quitting: 9.5  . Smokeless tobacco: Never Used  . Tobacco comment: smoked on/off for 23 years, quit over 10 yrs ago  Substance and Sexual Activity  . Alcohol use: Yes    Comment: rare   . Drug use: No  . Sexual activity: Not on file  Lifestyle  . Physical activity:    Days per week: Not on file    Minutes per session: Not on file  .  Stress: Not on file  Relationships  . Social connections:    Talks on phone: Not on file    Gets together: Not on file    Attends religious service: Not on file    Active member of club or organization: Not on file    Attends meetings of clubs or organizations: Not on file    Relationship status: Not on file  . Intimate partner violence:    Fear of current or ex partner: Not on file    Emotionally abused: Not on file    Physically abused: Not on file    Forced sexual activity: Not on file  Other Topics Concern  . Not on file  Social History Narrative  . Not on file    Family History:   The patient's family history includes Cancer in her sister; Heart Problems in her sister; Hypertension in her brother and father; Peripheral vascular disease in her sister; Stroke in her father.  ROS:  Please see the history of present illness.  All other ROS reviewed and negative.     Physical Exam/Data:   Vitals:   07/03/17 2100 07/03/17 2130 07/03/17 2248 07/04/17 0357  BP: (!) 114/52 (!) 101/50 119/68 (!) 104/59  Pulse: 69 71 65 60  Resp: Temp:   98.6 F (37 C) 98.4 F (36.9 C)  TempSrc:   Oral Oral  SpO2: 100% 99% 100% 98%  Weight:   256 lb 14.4 oz (116.5 kg) 256 lb 1.3 oz (116.2 kg)  Height:   5' 1.5" (1.562 m)     Intake/Output Summary (Last 24 hours) at 07/04/2017 0830 Last data filed at 07/04/2017  0340 Gross per 24 hour  Intake 728.75 ml  Output -  Net 728.75 ml   Filed Weights   07/03/17 1510 07/03/17 2248 07/04/17 0357  Weight: 258 lb 9.6 oz (117.3 kg) 256 lb 14.4 oz (116.5 kg) 256 lb 1.3 oz (116.2 kg)   Body mass index is 47.6 kg/m.  General: Well developed, well nourished obese WF, in no acute distress. Head: Normocephalic, atraumatic, sclera non-icteric, no xanthomas, nares are without discharge.  Neck: Negative for carotid bruits. JVD not elevated. Lungs: Clear bilaterally to auscultation without wheezes, rales, or rhonchi. Breathing is unlabored. Heart: RRR with S1 S2. No murmurs, rubs, or gallops appreciated. Abdomen: Soft, non-tender, non-distended with normoactive bowel sounds. No hepatomegaly. No rebound/guarding. No obvious abdominal masses. Msk:  Strength and tone appear normal for age. Extremities: No clubbing or cyanosis. Trace sockline edema superimposed on large baseline leg habitus.  Distal pedal pulses are 2+ and equal bilaterally. Neuro: Alert and oriented X 3. No facial asymmetry. No focal deficit. Moves all extremities spontaneously. Psych:  Responds to questions appropriately with a normal affect.  EKG:  The EKG was personally reviewed and demonstrates NSR 77bpm nonspecific ST-T changes similar to prior.  Relevant CV Studies: See above  Laboratory Data:  Chemistry Recent Labs  Lab 07/03/17 1535  NA 132*  K 4.1  CL 102  CO2 20*  GLUCOSE 108*  BUN 22*  CREATININE 1.71*  CALCIUM 9.7  GFRNONAA 30*  GFRAA 35*  ANIONGAP 10    Hematology Recent Labs  Lab 07/03/17 1535  WBC 5.1  RBC 3.91  HGB 12.8  HCT 36.9  MCV 94.4  MCH 32.7  MCHC 34.7  RDW 13.6  PLT 237   Cardiac Enzymes Recent Labs  Lab 07/03/17 1535 07/04/17 0024 07/04/17 0632  TROPONINI <0.03 <0.03 <0.03  BNP Recent Labs  Lab 07/04/17 0024  BNP 26.5     Radiology/Studies:  Dg Chest 2 View  Result Date: 07/03/2017 CLINICAL DATA:  Left-sided chest pain and nausea  for 1 day. EXAM: CHEST - 2 VIEW COMPARISON:  10/08/2013 FINDINGS: The heart size and mediastinal contours are within normal limits. Both lungs are clear. The visualized skeletal structures are unremarkable. IMPRESSION: Stable exam.  No active cardiopulmonary disease. Electronically Signed   By: Myles Rosenthal M.D.   On: 07/03/2017 15:31    Assessment and Plan:   1. Chest pain without objective evidence for ischemia - chest pain seems largely atypical but unprovoked. Troponins are negative and BNP unremarkable. EKG shows nonspecific changes which looks similar to 2016. She is chest pain free. She had fairly recent ischemic assessment 01/2017 which was normal. Given lack of objective evidence of ischemia, she could be considered for DC with OP follow-up and further testing if any recurrence of symptoms - I will review further evaluation with Dr. Antoine Poche. Question whether her AKI/dehydration/softer BP plays a role in her symptoms.  2. Acute kidney injury - IM feels due to NSAIDS/ARB and is managing.  3. HTN - has been on lower side intermittently - ARB on hold for above.  4. Chronic edema - unchanged for many years per patient. Previously unable to tolerate diuretic. This seems mild in the context of her overall body habitus. BNP is normal so it is unlikely this represents CHF  5. Morbid obesity - given unremarkable testing in 01/2017 suspect her chronic dyspnea is related to morbid obesity and deconditioning with little physical activity.   For questions or updates, please contact [facility] Please consult [email or web address] for contact info under Cardiology/STEMI.    Signed, Laurann Montana, PA-C  07/04/2017 8:30 AM   History and all data above reviewed.  Patient examined.  I agree with the findings as above.   The patient has pain with some typical and some atypical features.  No objective evidence of ischemia.  Currently pain free.  The pain happened at rest and went away slowly.  No treatment other  than ASA.  She had not had this before.  It was 5/10.  There were no associated symptoms.  It was midsternal without radiation.   The patient exam reveals COR:RRR  ,  Lungs:   clear  ,  Abd: Positive bowel sounds, no rebound no guarding, Ext no edema  .  All available labs, radiology testing, previous records reviewed. Agree with documented assessment and plan. Chest pain:  OK to discharge.  No high risk findings.  However, because of the description of the pain I think further testing will be needed as an out patient.  I would like to avoid contrast given her elevated creat.  I will suggest an out patient dobutamine stress echo.   Fayrene Fearing [facility]  10:47 AM  07/04/2017

## 2017-07-09 ENCOUNTER — Ambulatory Visit: Payer: Self-pay | Admitting: Podiatry

## 2017-07-11 ENCOUNTER — Encounter: Payer: Self-pay | Admitting: Physician Assistant

## 2017-07-11 NOTE — Progress Notes (Signed)
RE: Needs dobutamine stress echo and f/u  Received: Yesterday  Message Contents  Young Berry, Jocelyn Lamer, Tacey Ruiz, PA-C        Called Pt to set up test and patient states she is not interested in doing this test.   Previous Messages    ----- Message -----  From: Sherrilyn Rist  Sent: 07/08/2017  2:56 PM  To: Anderson [facility] [place]  Subject: FW: Needs dobutamine stress echo and f/u     5.3. Carney Bern left a message on home vm - notation in active request   gesila  ----- Message -----  From: Laurann Montana, PA-C  Sent: 07/04/2017 11:28 AM  To: Mickie Bail [facility] Scheduling  Subject: Needs dobutamine stress echo and f/u       Primary Cardiologist: Dr. Eden Emms  Date of Discharge: 07/04/2017   Needs an outpatient dobutamine stress echo scheduled as well as a follow-up in the week or so afterwards with either Dr. Eden Emms or his care team. Pt weighs 256lb and test is for chest pain. Since she's being discharged, please call patient with instructions on where to go for test and f/u appt info.   Thank you!  Nickalaus Crooke PA-C

## 2017-07-12 ENCOUNTER — Encounter: Payer: Self-pay | Admitting: Physician Assistant

## 2017-07-12 ENCOUNTER — Other Ambulatory Visit: Payer: Self-pay | Admitting: Orthopedic Surgery

## 2017-07-12 DIAGNOSIS — M4696 Unspecified inflammatory spondylopathy, lumbar region: Secondary | ICD-10-CM

## 2017-07-12 DIAGNOSIS — M5416 Radiculopathy, lumbar region: Secondary | ICD-10-CM

## 2017-07-12 NOTE — Progress Notes (Signed)
Sharon Sole, PA-C        [name].   Patient is refusing the Dobutamine stress echocardiogram. We are removing her from the [facility].    Thanks

## 2017-07-15 ENCOUNTER — Other Ambulatory Visit (HOSPITAL_COMMUNITY): Payer: BLUE CROSS/BLUE SHIELD

## 2017-07-20 ENCOUNTER — Ambulatory Visit
Admission: RE | Admit: 2017-07-20 | Discharge: 2017-07-20 | Disposition: A | Discharge status: Home / Self Care | Payer: BLUE CROSS/BLUE SHIELD | Source: Ambulatory Visit | Attending: Orthopedic Surgery | Admitting: Orthopedic Surgery

## 2017-07-20 DIAGNOSIS — M5416 Radiculopathy, lumbar region: Secondary | ICD-10-CM

## 2017-07-20 DIAGNOSIS — M4696 Unspecified inflammatory spondylopathy, lumbar region: Secondary | ICD-10-CM

## 2017-10-10 ENCOUNTER — Ambulatory Visit (INDEPENDENT_AMBULATORY_CARE_PROVIDER_SITE_OTHER): Payer: Medicare Other | Admitting: Podiatry

## 2017-10-10 ENCOUNTER — Encounter: Payer: Self-pay | Admitting: Podiatry

## 2017-10-10 ENCOUNTER — Ambulatory Visit: Payer: Self-pay

## 2017-10-10 VITALS — BP 135/72 | HR 80 | Resp 16

## 2017-10-10 DIAGNOSIS — M898X7 Other specified disorders of bone, ankle and foot: Secondary | ICD-10-CM

## 2017-10-10 DIAGNOSIS — S9031XA Contusion of right foot, initial encounter: Secondary | ICD-10-CM

## 2017-10-10 DIAGNOSIS — M2042 Other hammer toe(s) (acquired), left foot: Secondary | ICD-10-CM | POA: Diagnosis not present

## 2017-10-10 NOTE — Progress Notes (Signed)
Subjective:  Patient ID: Sharon Mckee, female    DOB: June 11, 1950,  MRN: [id number] HPI Chief Complaint  Patient presents with  . Toe Pain    5th toe left - aching x few months, initially used a medicated corn pad and peeled the skin away-Dr. Elijah Birk evaluated - xrayed and said she had bone spur, gave surgical shoe to wear, still tender  . NOTE    Patient wanted to mention Dr. Elijah Birk said she also had a broken bone in right foot, put in a boot, right foot doesn't really hurt anymore  . New Patient (Initial Visit)    pt brought xrays    67 y.o. female presents with the above complaint.   ROS: Denies fever chills nausea vomiting muscle aches pains calf pain back pain chest pain shortness of breath.  Past Medical History:  Diagnosis Date  . Anxiety   . Bipolar 1 disorder (HCC)   . Chronic edema    a. diuretics stopped in the past due to hyponatremia.  Marland Kitchen GERD (gastroesophageal reflux disease)   . H/O cardiovascular stress test    a. 01/2017: normal.  . Hypertension   . Hyponatremia   . Hypothyroid   . Morbid obesity (HCC)    a. lapband 2008 but gained most of weight back.  . OSA (obstructive sleep apnea)   . Thyroid disease    Past Surgical History:  Procedure Laterality Date  . ABDOMINAL HYSTERECTOMY    . APPENDECTOMY    . CHOLECYSTECTOMY    . HIP RESURFACING    . LAPAROSCOPIC GASTRIC BANDING    . TUBAL LIGATION    . UVULOPALATOPHARYNGOPLASTY      Current Outpatient Medications:  .  SPIRONOLACTONE PO, Take by mouth., Disp: , Rfl:  .  ALPRAZolam (XANAX) 1 MG tablet, Take by mouth 3 (three) times daily as needed for anxiety. , Disp: , Rfl:  .  amLODipine (NORVASC) 5 MG tablet, Take 1 tablet by mouth daily., Disp: , Rfl:  .  Armodafinil (NUVIGIL) 250 MG tablet, Take 250 mg by mouth daily., Disp: , Rfl:  .  Biotin 1 MG CAPS, Take 1,000 mg daily by mouth., Disp: , Rfl:  .  buPROPion (WELLBUTRIN XL) 150 MG 24 hr tablet, Take 150 mg by mouth daily., Disp: , Rfl:  .   Cholecalciferol (VITAMIN D3) 3000 units TABS, Take 3,000 Units by mouth daily., Disp: , Rfl:  .  lamoTRIgine (LAMICTAL) 200 MG tablet, TK 1 T  PO D, Disp: , Rfl: 0 .  levothyroxine (SYNTHROID, LEVOTHROID) 100 MCG tablet, Take 100 mcg by mouth daily before breakfast., Disp: , Rfl:  .  levothyroxine (SYNTHROID, LEVOTHROID) 125 MCG tablet, Take 125 mcg daily by mouth., Disp: , Rfl:  .  Magnesium 100 MG CAPS, Take 100 mg by mouth daily. , Disp: , Rfl:  .  Omega-3 Fatty Acids (FISH OIL PO), Take 1 capsule by mouth daily., Disp: , Rfl:  .  QUEtiapine (SEROQUEL) 50 MG tablet, TK 1 T  PO HS, Disp: , Rfl: 0 .  valsartan (DIOVAN) 320 MG tablet, , Disp: , Rfl:   No Known Allergies Review of Systems Objective:   Vitals:   10/10/17 1621  BP: 135/72  Pulse: 80  Resp: 16    General: Well developed, nourished, in no acute distress, alert and oriented x3   Dermatological: Skin is warm, dry and supple bilateral. Nails x 10 are well maintained; remaining integument appears unremarkable at this time. There are no open sores,  no preulcerative lesions, no rash or signs of infection present.  Vascular: Dorsalis Pedis artery and Posterior Tibial artery pedal pulses are 2/4 bilateral with immedate capillary fill time. Pedal hair growth present. No varicosities and no lower extremity edema present bilateral.   Neruologic: Grossly intact via light touch bilateral. Vibratory intact via tuning fork bilateral. Protective threshold with Semmes Wienstein monofilament intact to all pedal sites bilateral. Patellar and Achilles deep tendon reflexes 2+ bilateral. No Babinski or clonus noted bilateral.   Musculoskeletal: No gross boney pedal deformities bilateral. No pain, crepitus, or limitation noted with foot and ankle range of motion bilateral. Muscular strength 5/5 in all groups tested bilateral.  Adductovarus rotated hammertoe deformity fifth left reducible in nature.  Moderately tender on palpation dorsal lateral  condyle of the proximal phalanx.  Gait: Unassisted, Nonantalgic.    Radiographs:  Evaluated radiographs brought with her today demonstrating adductovarus rotated hammertoe deformity fifth left no fractures identified.    Assessment & Plan:   Assessment: Adductovarus rotated hammertoe deformity fifth left.  Plan: We discussed the etiology pathology conservative surgical therapies at this point time she like to consider a derotational arthroplasty fifth digit left foot.  I answered all the questions regarding to the procedure the best my billing layman's terms she understood it was amenable to it signed all 3 pages a consent form.  We did discuss the possible postop complications which may include but are not limited to postop pain bleeding swelling infection recurrence need further surgery overcorrection under correction chronic pain.  She already has a Darco shoe at home she was provided with both oral and written home-going instruction for the preop.  As well as anesthesia group and the surgery center.     Justen Fonda T. LincolndaleHyatt, [place]

## 2017-10-10 NOTE — Patient Instructions (Signed)
Pre-Operative Instructions  Congratulations, you have decided to take an important step towards improving your quality of life.  You can be assured that the doctors and staff at [facility] will be with you every step of the way.  Here are some important things you should know:  1. Plan to be at the surgery center/hospital at least 1 (one) hour prior to your scheduled time, unless otherwise directed by the surgical center/hospital staff.  You must have a responsible adult accompany you, remain during the surgery and drive you home.  Make sure you have directions to the surgical center/hospital to ensure you arrive on time. 2. If you are having surgery at [facility] or [facility], you will need a copy of your medical history and physical form from your family physician within one month prior to the date of surgery. We will give you a form for your primary physician to complete.  3. We make every effort to accommodate the date you request for surgery.  However, there are times where surgery dates or times have to be moved.  We will contact you as soon as possible if a change in schedule is required.   4. No aspirin/ibuprofen for one week before surgery.  If you are on aspirin, any non-steroidal anti-inflammatory medications (Mobic, Aleve, Ibuprofen) should not be taken seven (7) days prior to your surgery.  You make take Tylenol for pain prior to surgery.  5. Medications - If you are taking daily heart and blood pressure medications, seizure, reflux, allergy, asthma, anxiety, pain or diabetes medications, make sure you notify the surgery center/hospital before the day of surgery so they can tell you which medications you should take or avoid the day of surgery. 6. No food or drink after midnight the night before surgery unless directed otherwise by surgical center/hospital staff. 7. No alcoholic beverages 24-hours prior to surgery.  No smoking 24-hours prior or 24-hours after  surgery. 8. Wear loose pants or shorts. They should be loose enough to fit over bandages, boots, and casts. 9. Don't wear slip-on shoes. Sneakers are preferred. 10. Bring your boot with you to the surgery center/hospital.  Also bring crutches or a walker if your physician has prescribed it for you.  If you do not have this equipment, it will be provided for you after surgery. 11. If you have not been contacted by the surgery center/hospital by the day before your surgery, call to confirm the date and time of your surgery. 12. Leave-time from work may vary depending on the type of surgery you have.  Appropriate arrangements should be made prior to surgery with your employer. 13. Prescriptions will be provided immediately following surgery by your doctor.  Fill these as soon as possible after surgery and take the medication as directed. Pain medications will not be refilled on weekends and must be approved by the doctor. 14. Remove nail polish on the operative foot and avoid getting pedicures prior to surgery. 15. Wash the night before surgery.  The night before surgery wash the foot and leg well with water and the antibacterial soap provided. Be sure to pay special attention to beneath the toenails and in between the toes.  Wash for at least three (3) minutes. Rinse thoroughly with water and dry well with a towel.  Perform this wash unless told not to do so by your physician.  Enclosed: 1 Ice pack (please put in freezer the night before surgery)   1 Hibiclens skin cleaner     Pre-op instructions  If you have any questions regarding the instructions, please do not hesitate to call our office.  [place]: [address], [place], [place] [address] -- [phone]  [place]: [address] [place], [place] [address] -- [phone]  [place]: [address]  [place], [place] [address] -- [phone]  [place]: [address], [address], [place], [place] [address] -- [phone]  Website:  [email or web address] 

## 2017-10-15 ENCOUNTER — Telehealth: Payer: Self-pay | Admitting: *Deleted

## 2017-10-15 NOTE — Telephone Encounter (Signed)
"  I want to schedule my surgery.  I saw Dr. Al CorpusHyatt last week.  I wasn't sure what would be a good date so I told the nurse I'd call back."  Dr. Geryl RankinsHyatt's next available is September 27.  "The nurse told me that September 6 was the next available."  It may have been at the time but those slots have gotten taken.  "I guess go ahead and put me down for then."

## 2017-11-07 ENCOUNTER — Telehealth: Payer: Self-pay | Admitting: *Deleted

## 2017-11-07 NOTE — Telephone Encounter (Signed)
"  I'm scheduled for surgery on September 27.  I'd like to cancel that.  Can you call me back to confirm this has been canceled?"

## 2017-11-12 NOTE — Telephone Encounter (Signed)
I am returning your call.  Yes, I canceled your surgery.  May I ask for the reason of the cancellation?  Would you like to reschedule?  "No, I am cancelling because I'm moving to [place].  I'm actually closing on my house that day."  I will let Dr. Al Corpus know.  I wish you well.  "Thank you."  I canceled the surgery in [facility].

## 2017-11-12 NOTE — Telephone Encounter (Signed)
"  I called last week leaving a message that I want to cancel my surgery on 09/27.  I didn't get a call letting me know that you got my message.  If you'd please call me back and let me know it has been canceled, I'd appreciate it."

## 2017-12-05 ENCOUNTER — Other Ambulatory Visit: Payer: Medicare Other

## 2018-01-01 ENCOUNTER — Encounter: Payer: Self-pay | Admitting: Emergency Medicine

## 2018-01-01 DIAGNOSIS — F4010 Social phobia, unspecified: Secondary | ICD-10-CM | POA: Insufficient documentation

## 2018-01-01 DIAGNOSIS — F3161 Bipolar disorder, current episode mixed, mild: Secondary | ICD-10-CM | POA: Insufficient documentation

## 2018-01-01 DIAGNOSIS — F411 Generalized anxiety disorder: Secondary | ICD-10-CM | POA: Insufficient documentation

## 2018-01-05 ENCOUNTER — Other Ambulatory Visit: Payer: Self-pay | Admitting: Physician Assistant

## 2018-01-11 ENCOUNTER — Other Ambulatory Visit: Payer: Self-pay | Admitting: Physician Assistant

## 2018-01-16 ENCOUNTER — Encounter: Payer: Self-pay | Admitting: Physician Assistant

## 2018-01-16 ENCOUNTER — Ambulatory Visit (INDEPENDENT_AMBULATORY_CARE_PROVIDER_SITE_OTHER): Payer: Medicare Other | Admitting: Physician Assistant

## 2018-01-16 DIAGNOSIS — F4010 Social phobia, unspecified: Secondary | ICD-10-CM | POA: Diagnosis not present

## 2018-01-16 DIAGNOSIS — F411 Generalized anxiety disorder: Secondary | ICD-10-CM | POA: Diagnosis not present

## 2018-01-16 DIAGNOSIS — F319 Bipolar disorder, unspecified: Secondary | ICD-10-CM | POA: Diagnosis not present

## 2018-01-16 MED ORDER — ALPRAZOLAM 1 MG PO TABS: 1.0000 mg | ORAL_TABLET | Freq: Three times a day (TID) | ORAL | 5 refills | Status: AC | PRN

## 2018-01-16 NOTE — Progress Notes (Signed)
[facility]  Patient ID: Sharon Mckee,  MRN: [id number]  PCP: Mila PalmerWolters, Sharon, MD  Date of Evaluation: 01/16/2018 Time spent:15 minutes  Chief Complaint:  Chief Complaint    Follow-up      HISTORY/CURRENT STATUS: HPI Here for 3 month med check.  Patient denies loss of interest in usual activities and is able to enjoy things.  Denies decreased energy or motivation.  Appetite has not changed.  No extreme sadness, tearfulness, or feelings of hopelessness.  Denies any changes in concentration, making decisions or remembering things.  Denies suicidal or homicidal thoughts.  Anxiety is well-controlled. Uses the Xanax prn which is very helpful.  She sleeps well.  Individual Medical History/ Review of Systems: Changes? :No   Allergies: Patient has no known allergies.   Current Medications:  Current Outpatient Medications:  .  ALPRAZolam (XANAX) 1 MG tablet, Take 1 tablet (1 mg total) by mouth 3 (three) times daily as needed for anxiety., Disp: 90 tablet, Rfl: 5 .  amLODipine (NORVASC) 5 MG tablet, Take 1 tablet by mouth daily., Disp: , Rfl:  .  Biotin 1 MG CAPS, Take 1,000 mg daily by mouth., Disp: , Rfl:  .  buPROPion (WELLBUTRIN XL) 150 MG 24 hr tablet, TAKE 1 TABLET BY MOUTH  EVERY MORNING, Disp: 90 tablet, Rfl: 0 .  Cholecalciferol (VITAMIN D3) 3000 units TABS, Take 3,000 Units by mouth daily., Disp: , Rfl:  .  lamoTRIgine (LAMICTAL) 200 MG tablet, TAKE 1 TABLET BY MOUTH  EVERY DAY, Disp: 90 tablet, Rfl: 0 .  levothyroxine (SYNTHROID, LEVOTHROID) 100 MCG tablet, Take 100 mcg by mouth daily before breakfast., Disp: , Rfl:  .  levothyroxine (SYNTHROID, LEVOTHROID) 125 MCG tablet, Take 125 mcg daily by mouth., Disp: , Rfl:  .  Magnesium 100 MG CAPS, Take 100 mg by mouth daily. , Disp: , Rfl:  .  Omega-3 Fatty Acids (FISH OIL PO), Take 1 capsule by mouth daily., Disp: , Rfl:  .  QUEtiapine (SEROQUEL) 50 MG tablet, TAKE 1 TABLET BY MOUTH  EVERY NIGHT AT BEDTIME, Disp: 90  tablet, Rfl: 0 .  valsartan (DIOVAN) 320 MG tablet, , Disp: , Rfl:  .  SPIRONOLACTONE PO, Take by mouth., Disp: , Rfl:  Medication Side Effects: none  Family Medical/ Social History: Changes? Will be moving to [place] soon.  Right now she's living with her dtr and her family.  Will be moving permanently early next year. She'll be going back and forth some before then. Her son lives there and dtr is here.  "I have a lot of emotions but I'm ok.  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  MENTAL HEALTH EXAM:  There were no vitals taken for this visit.There is no height or weight on file to calculate BMI.  General Appearance: Well Groomed and Obese  Eye Contact:  Good  Speech:  Clear and Coherent  Volume:  Normal  Mood:  Euthymic  Affect:  Appropriate  Thought Process:  Goal Directed  Orientation:  Full (Time, Place, and Person)  Thought Content: Logical   Suicidal Thoughts:  No  Homicidal Thoughts:  No  Memory:  WNL  Judgement:  Good  Insight:  Good  Psychomotor Activity:  Normal  Concentration:  Concentration: Good  Recall:  Good  Fund of Knowledge: Good  Language: Good  Assets:  Desire for Improvement  ADL's:  Intact  Cognition: WNL  Prognosis:  Good  DIAGNOSES:    ICD-10-CM   1. Generalized anxiety disorder F41.1   2. Social anxiety disorder F40.10   3. Bipolar I disorder (HCC) F31.9     Receiving Psychotherapy: No    RECOMMENDATIONS: Continue current medications as noted above. Normally, I would have her come back in 6 months at this point since she is doing so well.  However since she will have moved we will just ask that she come back as needed when she is in town to visit her daughter.  I have agreed to make sure her medications do not run out through August 2020.  I have encouraged her however to find a psychiatric provider or PCP who will refill these medications.  She verbalizes  understanding. Return as needed.  Melony Overly, PA-C

## 2018-02-16 ENCOUNTER — Other Ambulatory Visit: Payer: Self-pay | Admitting: Physician Assistant

## 2018-04-03 ENCOUNTER — Other Ambulatory Visit: Payer: Self-pay | Admitting: Physician Assistant

## 2018-05-29 ENCOUNTER — Telehealth: Payer: Self-pay | Admitting: Physician Assistant

## 2018-05-29 NOTE — Telephone Encounter (Signed)
Would one of you please call her.  I think she's moved out of [place] but I told her I'd give her meds thru 8/20, I think.  Anyway, if appropriate, increase the seroquel to 100mg  #30 w/ 1 RF.  She needs to see me or another provider asap

## 2018-05-29 NOTE — Telephone Encounter (Signed)
Sharon Mckee called to report that she is experiencing mania.  Not sleeping well.  Thinks she needs to increase her seroquel.  Please call to discuss.

## 2018-05-29 NOTE — Telephone Encounter (Signed)
Spoke with Pt. Acknowledged increase of Seroquel. Pt thought she had enough meds and Thanks you.

## 2018-05-29 NOTE — Telephone Encounter (Signed)
Left voicemail for patient to call back and get recommendation from provider

## 2018-07-07 ENCOUNTER — Other Ambulatory Visit: Payer: Self-pay | Admitting: Physician Assistant

## 2018-10-19 IMAGING — MR MR LUMBAR SPINE W/O CM
4 of 5 series · 18 of 48 positions shown · non-contrast
Comparison: 05/14/2011

CLINICAL DATA: Low back pain for many years. Bilateral posterior
leg pain. Inflammatory spondyloarthropathy of the lumbar region

EXAM:
MRI LUMBAR SPINE WITHOUT CONTRAST
TECHNIQUE: Multiplanar, multisequence MR imaging of the lumbar spine was
performed. No intravenous contrast was administered.

[Series 6: T2 · sagittal · 4.0mm · 0.73mm/px · 6 of 15 slices shown (1 of 2)]
[im 1/15]
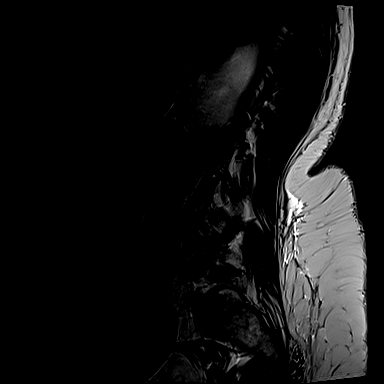
[im 3/15]
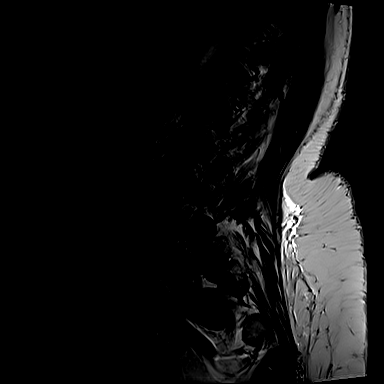
[im 6/15]
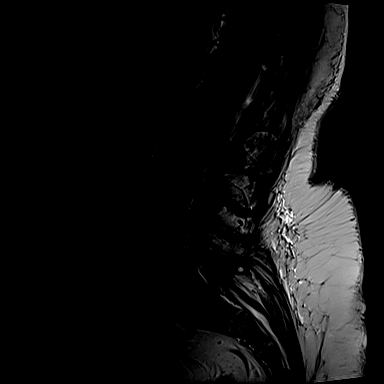
[im 9/15]
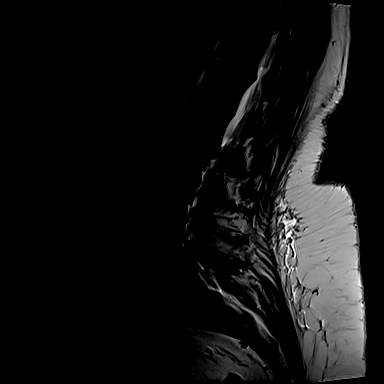
[im 12/15]
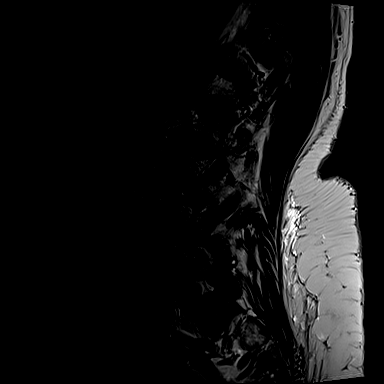
[im 15/15]
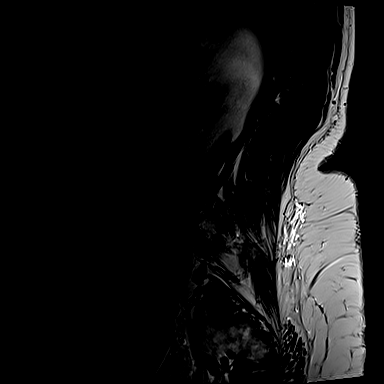

[Series 7: T1 · sagittal · 4.0mm · 0.73mm/px · 3 of 15 slices shown (1 of 2)]
[im 3/15]
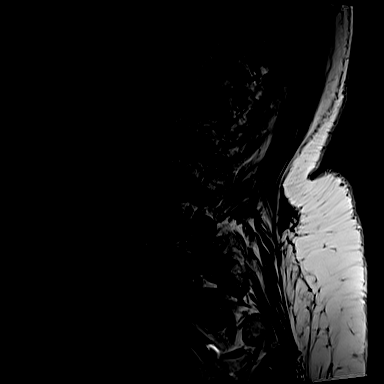
[im 9/15]
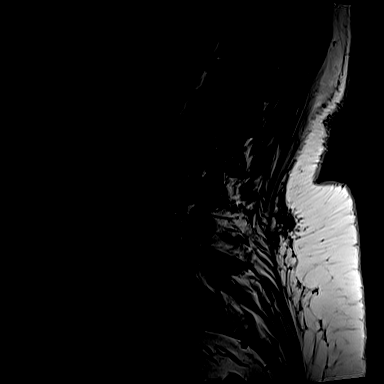
[im 15/15]
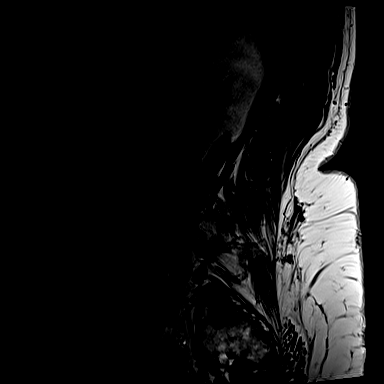

[Series 11: T1 · axial · 4.0mm · 0.28mm/px · z∈[-37,+123]mm · 3 of 39 slices shown (2 of 2)]
[im 6/39]
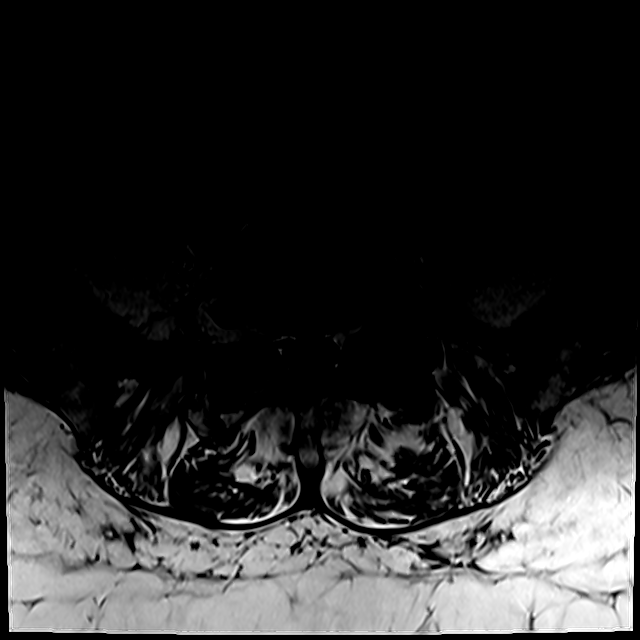
[im 20/39]
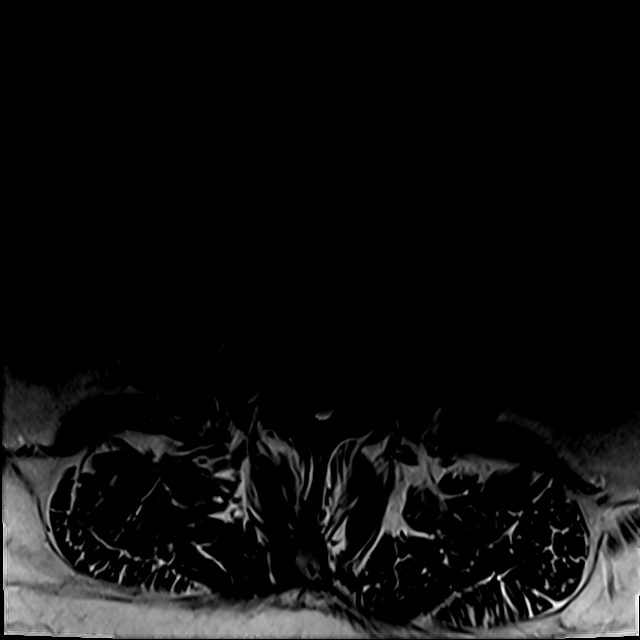
[im 33/39]
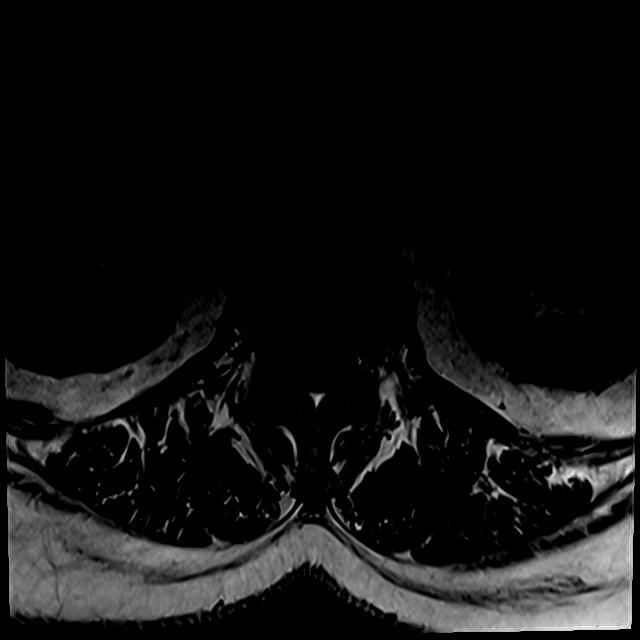

[Series 14: T2 · axial · 4.0mm · 0.28mm/px · z∈[-61,+123]mm · 6 of 39 slices shown (2 of 2)]
[im 1/39]
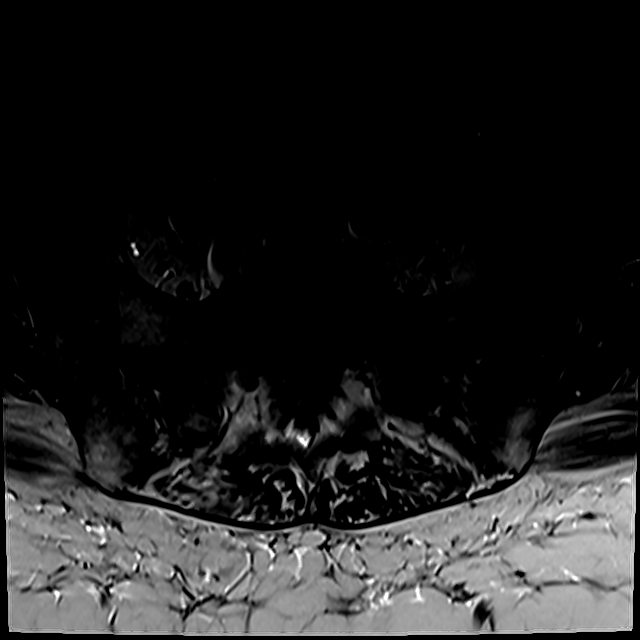
[im 6/39]
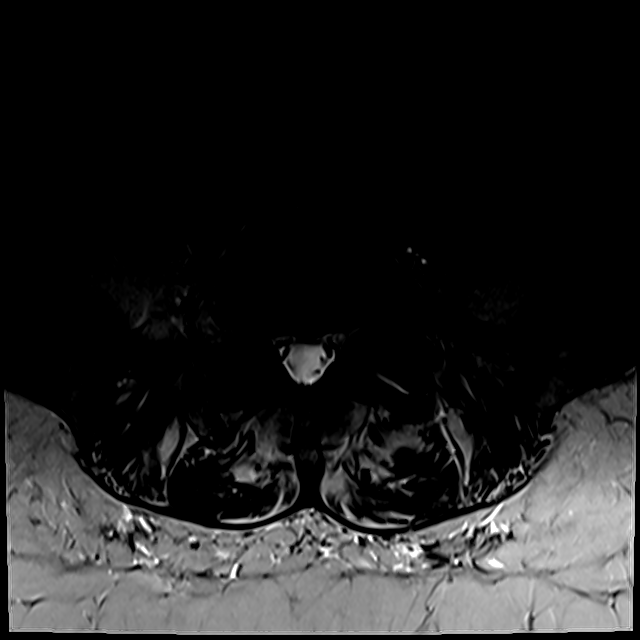
[im 11/39]
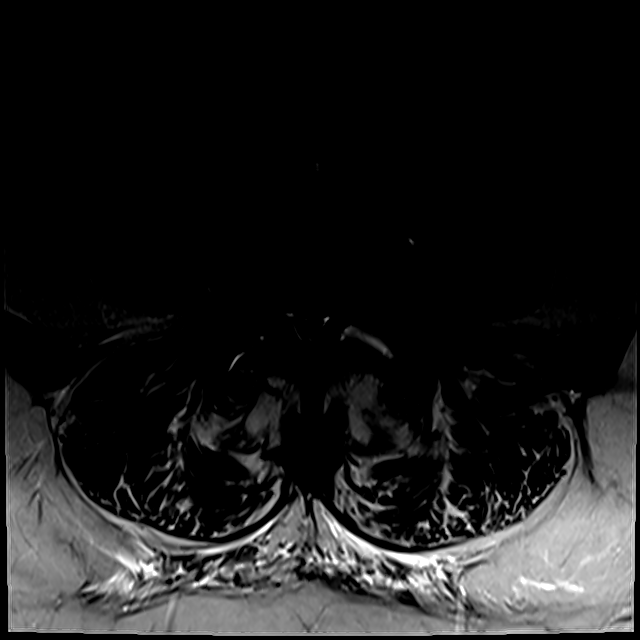
[im 17/39]
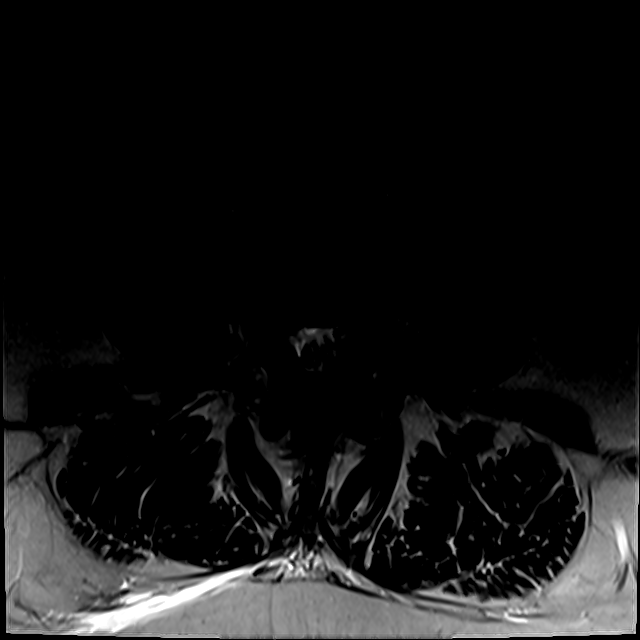
[im 20/39]
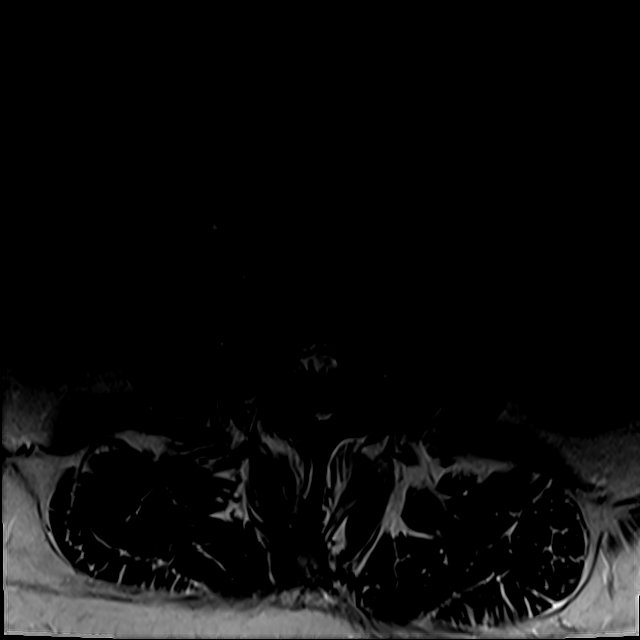
[im 33/39]
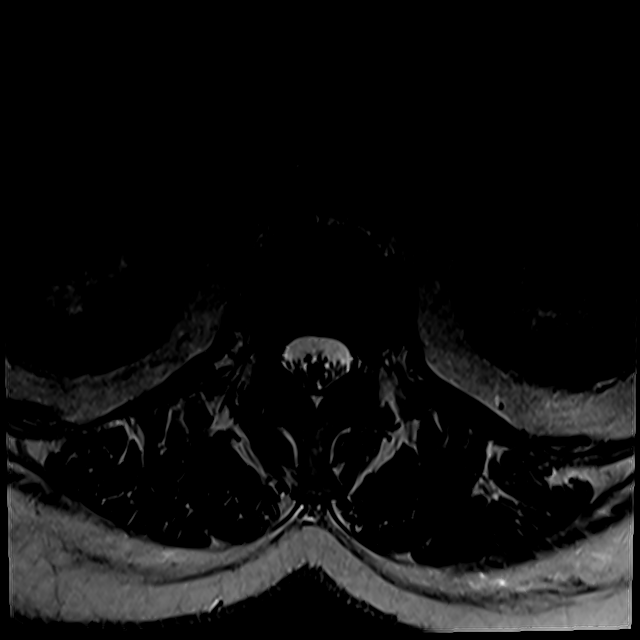

[18 of 48 positions shown; findings below may reference images not displayed]

FINDINGS: Segmentation:  Standard and stable from prior

Alignment: 5 mm of retrolisthesis at L1-2, accentuated by ridging,
chronic. Grade 1 anterolisthesis at L4-5, likely progressed.

Vertebrae: L4-5 facet arthritis with marrow edema on the right and
joint effusion on the left.

Conus medullaris and cauda equina: Conus extends to the L1 level.
Conus and cauda equina appear normal.

Paraspinal and other soft tissues: No significant incidental
finding.

Disc levels:

T12- L1: Unremarkable.

L1-L2: Disc narrowing and endplate degeneration with ridging that
has progressed from prior. There is chronic retrolisthesis. Right
more than left foraminal stenosis which could affect either L1 nerve
root. Progressive and high-grade spinal stenosis.

L2-L3: Disc narrowing and bulging with endplate degeneration. Mild
posterior element hypertrophy. Moderate spinal stenosis.
Noncompressive left more than right foraminal narrowing

L3-L4: Disc narrowing and bulging with posterior element
hypertrophy. Endplate degeneration. Mild spinal stenosis.
Noncompressive bilateral foraminal narrowing

L4-L5: Severe facet arthropathy with gaping joints and right-sided
marrow edema. There is anterolisthesis with disc bulging. High-grade
spinal stenosis. Moderate left foraminal narrowing. Patent right
foramen

L5-S1:Severe facet arthropathy with spurring and hypertrophy.
Essentially negative disc. No impingement
IMPRESSION: 1. L4-5 and L5-S1 severe degenerative facet arthropathy. At L4-5
there is active facet arthritis on the right and large joint
effusion on the left.
2. L4-5 advanced spinal stenosis, progressed from 3719. Moderate
left foraminal narrowing.
3. L1-2 advanced spinal stenosis, progressed. Right more than left
foraminal stenosis.
4. L2-3 moderate spinal stenosis.

## 2019-08-02 ENCOUNTER — Inpatient Hospital Stay
Admit: 2019-08-02 | Discharge: 2019-08-03 | Disposition: A | Discharge status: Home / Self Care | Payer: MEDICARE | Attending: Emergency Medicine

## 2019-08-02 ENCOUNTER — Emergency Department: Admit: 2019-08-02 | Payer: MEDICARE | Primary: Family Medicine

## 2019-08-02 ENCOUNTER — Inpatient Hospital Stay
Admit: 2019-08-02 | Discharge: 2019-08-02 | Disposition: A | Discharge status: Other Acute Inpatient Hospital | Payer: MEDICARE

## 2019-08-02 DIAGNOSIS — S0592XA Unspecified injury of left eye and orbit, initial encounter: Secondary | ICD-10-CM

## 2019-08-02 DIAGNOSIS — S0181XA Laceration without foreign body of other part of head, initial encounter: Secondary | ICD-10-CM

## 2019-08-02 MED ORDER — LIDOCAINE-EPINEPHRINE 1 %-1:100000 IJ SOLN
1 %-:00000 | INTRAMUSCULAR | Status: DC
Start: 2019-08-02 — End: 2019-08-02
  Administered 2019-08-02: 22:00:00

## 2019-08-02 MED FILL — XYLOCAINE/EPINEPHRINE 1 %-1:100000 IJ SOLN: 1 %-:00000 | INTRAMUSCULAR | Qty: 20

## 2019-08-02 NOTE — ED Provider Notes (Addendum)
HPI:  08/02/19,   Time: 5:57 PM EDT         Margaret Zimmerman is a 68 y.o. female presenting to the ED for left maxillary laceration, beginning pta ago.  The complaint has been constant, moderate in severity, and worsened by nothing.  Has brought with her daughter-in-law who states they were at the [employer] game and she was struck in the left side of her face with a foul ball during the ball game.  She was evaluated by medics on scene who she states put some glue or something over the wound and then told her that she would be perfectly fine.  She states they left the game and in route to this urgent care the bleeding became excessive and began squirting.  They held pressure and then subsequently came right to the urgent care.  Upon arrival she has what appears to be an arterial bleed across the left maxillary from a laceration approximately 4 cm in length.  She has no pain or discomfort at the left orbital area.  She is on no anticoagulation.  She denies any loss of consciousness.  She is up-to-date on tetanus.    ROS:   Pertinent positives and negatives are stated within HPI, all other systems reviewed and are negative.  --------------------------------------------- PAST HISTORY ---------------------------------------------  Past Medical History:  has a past medical history of Hypertension.    Past Surgical History:  has no past surgical history on file.    Social History:  reports that she has never smoked. She has never used smokeless tobacco. She reports current alcohol use. She reports that she does not use drugs.    Family History: family history is not on file.     The patient's home medications have been reviewed.    Allergies: Patient has no known allergies.    -------------------------------------------------- RESULTS -------------------------------------------------  All laboratory and radiology results have been personally reviewed by myself   LABS:  No results found for this visit on  08/02/19.    RADIOLOGY:  Interpreted by Radiologist.  No orders to display       ------------------------- NURSING NOTES AND VITALS REVIEWED ---------------------------   The nursing notes within the ED encounter and vital signs as below have been reviewed.   BP (!) 168/90   Pulse 87   Temp 98.7 F (37.1 C) (Temporal)   Resp 20   Ht 5\' 2"  (1.575 m)   Wt 240 lb (108.9 kg)   SpO2 98%   BMI 43.90 kg/m   Oxygen Saturation Interpretation: Normal      ---------------------------------------------------PHYSICAL EXAM--------------------------------------      Constitutional/General: Alert and oriented x3, well appearing, non toxic in NAD  Head: NC/AT, laceration of the left maxillary approximately 4 cm in total length.  She has what appears to be an arterial bleed from the center of the laceration.  Eyes: PERRL, EOMI, pupils equal round reactive to light and accommodation.  No evidence of globe entrapment.  She has visible ecchymosis around the left orbital area.  She has no tenderness at the supra or infraorbital area.  Mouth: Oropharynx clear, handling secretions, no trismus  Neck: Supple, full ROM, no meningeal signs  Pulmonary: Lungs clear to auscultation bilaterally, no wheezes, rales, or rhonchi. Not in respiratory distress  Cardiovascular:  Regular rate and rhythm, no murmurs, gallops, or rubs. 2+ distal pulses  Abdomen: Soft, non tender, non distended,   Extremities: Moves all extremities x 4. Warm and well perfused  Skin: warm  and dry without rash  Neurologic: GCS 15,  Psych: Normal Affect      LACERATION REPAIR  PROCEDURE NOTE:  Unless otherwise indicated, this procedure was done or directly supervised by the ED attending. Performed By: Ena Dawley, CNP.    Laceration #: 1.  Location: Left maxillary  Length: 4.0cm.  The wound area was irrigated with sterile water and draped in a sterile fashion.  The wound area was anesthetized with Lidocaine 1% with epinephrine.  WOUND COMPLEXITY:    Debridement:  None.  Undermining: None.  Wound Margins Revised: None.  Flaps Aligned: yes.  The wound was explored with the following results no foreign body or tendon injury seen.  The wound was closed with 4-0 Prolene using interrupted sutures.  Dressing:  a bandage was placed.    Total number suture: 6    ------------------------------ ED COURSE/MEDICAL DECISION MAKING----------------------  Medications   lidocaine-EPINEPHrine 1 %-1:100000 injection (has no administration in time range)         Medical Decision Making:    Call to [facility] emergency room spoke with ED attending Dr. Magnus Ivan he was given the patient's history and series of events in her physical examination he recommends the patient go to [facility] for trauma evaluation.  Call to [facility] in [place] spoke with Dr. Consuella Lose, he was updated on patient's clinical presentation physical exam and the recommendation by Mady Gemma attending patient will be sent to [facility] for evaluation regarding concern for arterial bleed to the left maxillary area.  She continues to have oozing even after the sutures are placed.  EMS was called and she was subsequently transported by EMS to [facility] in [place].    Counseling:   The emergency provider has spoken with the patient and discussed today's results, in addition to providing specific details for the plan of care and counseling regarding the diagnosis and prognosis.  Questions are answered at this time and they are agreeable with the plan.      --------------------------------- IMPRESSION AND DISPOSITION ---------------------------------    IMPRESSION  1. Injury of head, initial encounter    2. Facial laceration, initial encounter        DISPOSITION  Disposition: Discharge to [facility] in [place]  Patient condition is stable                 Merian Capron, APRN - CNP  08/02/19 1802           Merian Capron, APRN - CNP  08/02/19 1802

## 2019-08-02 NOTE — ED Provider Notes (Signed)
Margaret Zimmerman is a 69 y.o. female with a PMHx significant for HTN who presents for evaluation of left eye injury, beginning prior to arrival.  The complaint has been persistent, moderate in severity, and worsened by nothing.   The patient states that she was at a [employer] when a foul ball bounced off the deck and came back hitting her in the eye. The patient was evaluated there and was cleared to go home. As she was driving home, the patient notes that she started to bleed. She went to the local urgent care for the bleeding where there was sutures placed. It was felt that the hematoma was getting larger so she was transferred for further evaluation treatment.    The history is provided by the patient and medical records.         Review of Systems   Constitutional: Negative for chills, diaphoresis and fever.   HENT: Negative for congestion, rhinorrhea and sore throat.    Eyes: Positive for pain and redness. Negative for visual disturbance.   Respiratory: Negative for cough and shortness of breath.    Cardiovascular: Negative for chest pain.   Gastrointestinal: Negative for abdominal pain, diarrhea, nausea and vomiting.   Genitourinary: Negative for dysuria and urgency.   Musculoskeletal: Negative for back pain.   Skin: Negative for rash.   Neurological: Negative for dizziness, light-headedness, numbness and headaches.        Physical Exam  Vitals and nursing note reviewed.   Constitutional:       General: She is not in acute distress.     Appearance: Normal appearance. She is well-developed. She is not ill-appearing.   HENT:      Head: Normocephalic.      Comments: Large ecchymosis below the left eye     Right Ear: External ear normal.      Left Ear: External ear normal.   Eyes:      General:         Right eye: No discharge.         Left eye: Discharge present.     Extraocular Movements: Extraocular movements intact.      Conjunctiva/sclera: Conjunctivae normal.      Comments: Conjunctival hemorrhage  to left eye no evidence of hyphema   Cardiovascular:      Rate and Rhythm: Normal rate and regular rhythm.      Heart sounds: Normal heart sounds. No murmur heard.     Pulmonary:      Effort: Pulmonary effort is normal. No respiratory distress.      Breath sounds: Normal breath sounds. No stridor.   Abdominal:      General: There is no distension.      Palpations: Abdomen is soft. There is no mass.   Musculoskeletal:         General: No deformity. Normal range of motion.      Cervical back: Normal range of motion and neck supple.   Skin:     General: Skin is warm.      Findings: No rash.   Neurological:      Mental Status: She is alert and oriented to person, place, and time.      Cranial Nerves: No cranial nerve deficit.      Coordination: Coordination normal.                6cm x 3cm    Procedures       MDM   Margaret Lore  Zimmerman presents to the ED for evaluation of bleeding hematoma near eye. She was transferred here for further evaluation and treatment. Workup in the ED revealed imaging negative for acute findings.  Patient was observed in the ER for 3 hours without any evidence of expanding hematoma.  Patient continues to be non-toxic on re-evaluation. Findings were discussed with the patient and reasons to immediately return to the ED were articulated to them. They will follow-up with their PCP.    --------------------------------------------- PAST HISTORY ---------------------------------------------  Past Medical History:  has a past medical history of Hypertension.    Past Surgical History:  has no past surgical history on file.    Social History:  reports that she has never smoked. She has never used smokeless tobacco. She reports current alcohol use. She reports that she does not use drugs.    Family History: family history is not on file.     The patient???s home medications have been reviewed.    Allergies: Patient has no known allergies.    -------------------------------------------------- RESULTS  -------------------------------------------------  Labs:  No results found for this visit on 08/02/19.    Radiology:  CT HEAD WO CONTRAST   Final Result   No acute intracranial abnormality.  Mild age-related loss of brain volume and   minimal chronic periventricular ischemic changes.  Small chronic basal   ganglia lacunar infarcts.      No acute traumatic injury of the facial bones.      Prominent soft tissue swelling overlying the left side of the mandible and   maxilla, and left periorbital region consistent with large soft tissue   contusion.      Circumscribed rounded area of increased attenuation in the soft tissues   anterior to the left maxilla extending to the infraorbital region consistent   with soft tissue hematoma.         CT FACIAL BONES WO CONTRAST   Final Result   No acute intracranial abnormality.  Mild age-related loss of brain volume and   minimal chronic periventricular ischemic changes.  Small chronic basal   ganglia lacunar infarcts.      No acute traumatic injury of the facial bones.      Prominent soft tissue swelling overlying the left side of the mandible and   maxilla, and left periorbital region consistent with large soft tissue   contusion.      Circumscribed rounded area of increased attenuation in the soft tissues   anterior to the left maxilla extending to the infraorbital region consistent   with soft tissue hematoma.             ------------------------- NURSING NOTES AND VITALS REVIEWED ---------------------------  Date / Time Roomed:  08/02/2019  6:27 PM  ED Bed Assignment:  OTF/OTF    The nursing notes within the ED encounter and vital signs as below have been reviewed.   BP (!) 170/86    Pulse 81    Temp 98.9 ??F (37.2 ??C) (Temporal)    Resp 18    Wt 240 lb (108.9 kg)    SpO2 98%    BMI 43.90 kg/m??   Oxygen Saturation Interpretation: Normal      ------------------------------------------ PROGRESS NOTES ------------------------------------------  11:35 PM EDT  I have spoken with the  patient and discussed today???s results, in addition to providing specific details for the plan of care and counseling regarding the diagnosis and prognosis.  Their questions are answered at this time and they are agreeable with the plan.  I discussed at length with them reasons for immediate return here for re evaluation. They will followup with their primary care physician by calling their office tomorrow.      --------------------------------- ADDITIONAL PROVIDER NOTES ---------------------------------  At this time the patient is without objective evidence of an acute process requiring hospitalization or inpatient management.  They have remained hemodynamically stable throughout their entire ED visit and are stable for discharge with outpatient follow-up.     The plan has been discussed in detail and they are aware of the specific conditions for emergent return, as well as the importance of follow-up.      Discharge Medication List as of 08/02/2019  9:44 PM          Diagnosis:  1. Facial laceration, initial encounter    2. Hematoma        Disposition:  Patient's disposition: Discharge to home  Patient's condition is stable.                 Silas Sacramento, DO  Resident  08/04/19 [phone]

## 2019-08-02 NOTE — ED Notes (Signed)
Bed: 15  Expected date: 08/02/19  Expected time:   Means of arrival: [facility] Department  Comments:  Jones Broom, RN  08/02/19 1827

## 2019-08-02 NOTE — ED Notes (Signed)
Pt Walked into Urgent care with Her Eye covered with Towel .  Saturated with Bright Red blood as Noted on Towel.  Pt A/O x  3 with No LOC.  Provider Called to the Room.  EMS called by Registration.  Hassell Done Fire Arrived at [phone].   Provider [facility] & Instructed to Have EMS transport To [facility].      Dyanne Iha, LPN  67/67/20 [id number]

## 2019-08-03 MED ORDER — BACITRACIN ZINC 500 UNIT/GM EX OINT
500 UNIT/GM | Freq: Once | CUTANEOUS | Status: AC
Start: 2019-08-03 — End: 2019-08-02
  Administered 2019-08-03: 02:00:00 via TOPICAL

## 2019-08-03 MED ORDER — TETANUS-DIPHTH-ACELL PERTUSSIS 5-2.5-18.5 LF-MCG/0.5 IM SUSP
5-2.5-18.5-0.5 LF-MCG/0.5 | Freq: Once | INTRAMUSCULAR | Status: AC
Start: 2019-08-03 — End: 2019-08-02
  Administered 2019-08-03: 02:00:00 0.5 mL via INTRAMUSCULAR

## 2019-08-03 MED ORDER — HYDROCODONE-ACETAMINOPHEN 5-325 MG PO TABS
5-325 MG | Freq: Once | ORAL | Status: AC
Start: 2019-08-03 — End: 2019-08-02
  Administered 2019-08-03: 1 via ORAL

## 2019-08-03 MED FILL — HYDROCODONE-ACETAMINOPHEN 5-325 MG PO TABS: 5-325 mg | ORAL | Qty: 1

## 2019-08-03 MED FILL — BOOSTRIX 5-2.5-18.5 LF-MCG/0.5 IM SUSP: 5-2.5-18.5 LF-MCG/0.5 | INTRAMUSCULAR | Qty: 0.5

## 2019-08-03 MED FILL — BACITRACIN ZINC 500 UNIT/GM EX OINT: 500 UNIT/GM | CUTANEOUS | Qty: 28

## 2019-08-21 ENCOUNTER — Encounter: Attending: Facial Plastic Surgery | Primary: Family Medicine

## 2019-11-27 LAB — TYPE AND SCREEN
ABO/Rh: O POS
Antibody Screen: NEGATIVE

## 2019-12-08 LAB — BASIC METABOLIC PANEL
Anion Gap: 10 mmol/L (ref 7–16)
BUN: 8 mg/dL (ref 6–23)
CO2: 23 mmol/L (ref 22–29)
Calcium: 8.7 mg/dL (ref 8.6–10.2)
Chloride: 101 mmol/L (ref 98–107)
Creatinine: 0.8 mg/dL (ref 0.5–1.0)
GFR African American: >60
GFR Non-African American: >60 mL/min/{1.73_m2} (ref >=60)
Glucose: 159 mg/dL — ABNORMAL HIGH (ref 74–99)
Potassium: 4.5 mmol/L (ref 3.5–5.0)
Sodium: 134 mmol/L (ref 132–146)

## 2019-12-08 LAB — CBC
Hematocrit: 41.2 % (ref 34.0–48.0)
Hemoglobin: 13 g/dL (ref 11.5–15.5)
MCH: 32.5 pg (ref 26.0–35.0)
MCHC: 31.6 % — ABNORMAL LOW (ref 32.0–34.5)
MCV: 103 fL — ABNORMAL HIGH (ref 80.0–99.9)
MPV: 10.3 fL (ref 7.0–12.0)
Platelets: 180 E9/L (ref 130–450)
RBC: 4 E12/L (ref 3.50–5.50)
RDW: 13.2 fL (ref 11.5–15.0)
WBC: 9.2 E9/L (ref 4.5–11.5)

## 2019-12-09 LAB — CBC
Hematocrit: 38.9 % (ref 34.0–48.0)
Hemoglobin: 12.2 g/dL (ref 11.5–15.5)
MCH: 32.6 pg (ref 26.0–35.0)
MCHC: 31.4 % — ABNORMAL LOW (ref 32.0–34.5)
MCV: 104 fL — ABNORMAL HIGH (ref 80.0–99.9)
MPV: 10.4 fL (ref 7.0–12.0)
Platelets: 151 E9/L (ref 130–450)
RBC: 3.74 E12/L (ref 3.50–5.50)
RDW: 13.9 fL (ref 11.5–15.0)
WBC: 7.3 E9/L (ref 4.5–11.5)

## 2019-12-09 LAB — BASIC METABOLIC PANEL
Anion Gap: 10 mmol/L (ref 7–16)
BUN: 9 mg/dL (ref 6–23)
CO2: 24 mmol/L (ref 22–29)
Calcium: 9 mg/dL (ref 8.6–10.2)
Chloride: 99 mmol/L (ref 98–107)
Creatinine: 1 mg/dL (ref 0.5–1.0)
GFR African American: >60
GFR Non-African American: 55 mL/min/{1.73_m2} (ref >=60)
Glucose: 86 mg/dL (ref 74–99)
Potassium: 4.1 mmol/L (ref 3.5–5.0)
Sodium: 133 mmol/L (ref 132–146)

## 2021-09-11 NOTE — Progress Notes (Signed)
Formatting of this note might be different from the original.  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 All treatment options discussed including risks and benefits. Patient elects to defer surgical intervention at this time until she has had time to think about risks and benefits. Patient will return in 1 month to discuss treatment options.   Electronically signed by Lorenza Cambridge, DPM at 09/11/2021  4:09 PM EDT

## 2022-04-12 ENCOUNTER — Encounter

## 2022-04-12 ENCOUNTER — Ambulatory Visit: Admit: 2022-04-12 | Discharge: 2022-04-12 | Payer: MEDICARE | Attending: Family | Primary: Family

## 2022-04-12 ENCOUNTER — Encounter: Admit: 2022-04-12 | Discharge: 2022-04-12 | Payer: MEDICARE | Attending: Family | Primary: Family Medicine

## 2022-04-12 DIAGNOSIS — Z0000 Encounter for general adult medical examination without abnormal findings: Secondary | ICD-10-CM

## 2022-04-12 DIAGNOSIS — Z7689 Persons encountering health services in other specified circumstances: Secondary | ICD-10-CM

## 2022-04-12 MED ORDER — METHYLPREDNISOLONE 4 MG PO TBPK
4 | PACK | ORAL | 0 refills | Status: DC
Start: 2022-04-12 — End: 2022-04-12

## 2022-04-12 NOTE — Progress Notes (Signed)
Medicare Annual Wellness Visit    Margaret Zimmerman is here for [facility] AW[facility]    Assessment & Plan   Initial Medicare annual wellness visit    Recommendations for Preventive Services Due: see orders and patient instructions/AVS.  Recommended screening schedule for the next 5-10 years is provided to the patient in written form: see Patient Instructions/AVS.     Return for Medicare Annual Wellness Visit in 1 year.     Subjective       Patient's complete Health Risk Assessment and screening values have been reviewed and are found in Flowsheets. The following problems were reviewed today and where indicated follow up appointments were made and/or referrals ordered.    Positive Risk Factor Screenings with Interventions:    Fall Risk:  Do you feel unsteady or are you worried about falling? : (!) yes (unsteady, but feels that it is appropriate for her age)  2 or more falls in past year?: no  Fall with injury in past year?: (!) yes (moving a bookcase)     Interventions:    Reviewed medications, home hazards, visual acuity, and co-morbidities that can increase risk for falls       Drug Use:   Substance and Sexual Activity   Drug Use Yes    Types: Marijuana (Weed)    Comment: THC/medical marijuana card     Interventions:  Patient declined any further intervention or treatment         Activity, Diet, and Weight:  On average, how many days per week do you engage in moderate to strenuous exercise (like a brisk walk)?: 0 days  On average, how many minutes do you engage in exercise at this level?: 0 min    Do you eat balanced/healthy meals regularly?: Yes (cooks at home)    Body mass index is 35.12 kg/m. (!) Abnormal      Inactivity Interventions:  Patient declined any further interventions or treatment  Obesity Interventions:  Patient declines any further evaluation or treatment             Hearing Screen:  Do you or your family notice any trouble with your hearing that hasn't been managed with hearing aids?: (!) Yes (hearing  aids)    Interventions:  She has hearing aids    Vision Screen:  Do you have difficulty driving, watching TV, or doing any of your daily activities because of your eyesight?: (!) Yes (glasses)  Have you had an eye exam within the past year?: Yes  No results found.    Interventions:    She follows with Opthalmology       Tobacco Use:  Tobacco Use: Medium Risk (04/12/2022)    Patient History     Smoking Tobacco Use: Former     Smokeless Tobacco Use: Never     Passive Exposure: Not on file     E-cigarette/Vaping       Questions Responses    E-cigarette/Vaping Use Current Every Day User    Start Date     Passive Exposure     Quit Date     Counseling Given     Comments THC        Interventions:  Patient declined any further intervention or treatment                Objective   Vitals:    04/12/22 1243   BP: 132/82   Site: Left Upper Arm   Position: Sitting   Pulse: 79  Resp: 18   Temp: 97.2 F (36.2 C)   TempSrc: Temporal   SpO2: 98%   Weight: 87.1 kg (192 lb)   Height: 1.575 m (5\' 2" )      Body mass index is 35.12 kg/m.             No Known Allergies  Prior to Visit Medications    Medication Sig Taking? Authorizing Provider   levothyroxine (SYNTHROID) 125 MCG tablet Take 2 tablets by mouth Daily Yes [provider]   Cholecalciferol (VITAMIN D3) 1000 units CAPS Take 1,000 Units by mouth daily Yes [provider]   Multiple Vitamin (MULTIVITAMIN ADULT PO) Take 1 capsule by mouth daily Yes [provider]   B-Complex TABS Take 1 tablet by mouth daily Yes [provider]   vitamin B-6 (PYRIDOXINE) 100 MG tablet Take 1 tablet by mouth daily Yes [provider]   valsartan (DIOVAN) 320 MG tablet Take 1 tablet by mouth daily Yes [provider]   buPROPion (WELLBUTRIN XL) 150 MG extended release tablet Take 1 tablet by mouth every morning Yes [provider]   lamoTRIgine (LAMICTAL) 200 MG tablet Take 1 tablet by mouth daily Yes [provider]    QUEtiapine Fumarate 150 MG TABS Take 150 mg by mouth at bedtime Yes [provider]   calcium carbonate (OSCAL) 500 MG TABS tablet Take 1 tablet by mouth daily Yes [provider]   zinc sulfate (ZINCATE) 220 (50 Zn) MG capsule Take 1 capsule by mouth daily Yes [provider]   ALPRAZolam (XANAX) 1 MG tablet Take 1 tablet by mouth daily as needed for Sleep. 2 a day as needed Yes [provider]   ascorbic acid (VITAMIN C) 500 MG tablet Take 1 tablet by mouth daily Yes [provider]   methylPREDNISolone (MEDROL DOSEPACK) 4 MG tablet Take by mouth.  [name], APRN - CNP       CareTeam (Including outside providers/suppliers regularly involved in providing care):   Patient Care Team:  [name], APRN - CNP as PCP - General (Nurse Practitioner)     Reviewed and updated this visit:  Allergies  Meds  Problems  Med Hx  Surg Hx  Soc Hx  Fam Hx

## 2022-04-12 NOTE — Patient Instructions (Signed)
Substance Use Disorder: Care Instructions  Overview     You can improve your life and health by stopping your use of alcohol or drugs. When you don't drink or use drugs, you may feel and sleep better. You may get along better with your family, friends, and coworkers. There are medicines and programs that can help with substance use disorder.  How can you care for yourself at home?  Here are some ways to help you stay sober and prevent relapse.  If you have been given medicine to help keep you sober or reduce your cravings, be sure to take it exactly as prescribed.  Talk to your doctor about programs that can help you stop using drugs or drinking alcohol.  Do not keep alcohol or drugs in your home.  Plan ahead. Think about what you'll say if other people ask you to drink or use drugs. Try not to spend time with people who drink or use drugs.  Use the time and money spent on drinking or drugs to do something that's important to you.  Preventing a relapse  Have a plan to deal with relapse. Learn to recognize changes in your thinking that lead you to drink or use drugs. Get help before you start to drink or use drugs again.  Try to stay away from situations, friends, or places that may lead you to drink or use drugs.  If you feel the need to drink alcohol or use drugs again, seek help right away. Call a trusted friend or family member. Some people get support from organizations such as Narcotics Anonymous or SMART Recovery or from treatment facilities.  If you relapse, get help as soon as you can. Some people make a plan with another person that outlines what they want that person to do for them if they relapse. The plan usually includes how to handle the relapse and who to notify in case of relapse.  Don't give up. Remember that a relapse doesn't mean that you have failed. Use the experience to learn the triggers that lead you to drink or use drugs. Then quit again. Recovery is a lifelong process. Many people have  several relapses before they are able to quit for good.  Follow-up care is a key part of your treatment and safety. Be sure to make and go to all appointments, and call your doctor if you are having problems. It's also a good idea to know your test results and keep a list of the medicines you take.  When should you call for help?   Call 911  anytime you think you may need emergency care. For example, call if you or someone else:   Has overdosed or has withdrawal signs. Be sure to tell the emergency workers that you are or someone else is using or trying to quit using drugs. Overdose or withdrawal signs may include:  Losing consciousness.  Seizure.  Seeing or hearing things that aren't there (hallucinations).    Is thinking or talking about suicide or harming others.   Where to get help 24 hours a day, 7 days a week   If you or someone you know talks about suicide, self-harm, a mental health crisis, a substance use crisis, or any other kind of emotional distress, get help right away. You can:   Call the Suicide and Crisis Lifeline at 23.    Call 1-800-273-TALK 540-277-7569).    Text HOME to (978)199-7640 to access the Crisis Text Line.   Consider saving  these numbers in your phone.  Go to 988lifeline.org for more information or to chat online.  Call your doctor now or seek immediate medical care if:   You are having withdrawal symptoms. These may include nausea or vomiting, sweating, shakiness, and anxiety.   Watch closely for changes in your health, and be sure to contact your doctor if:   You have a relapse.    You need more help or support to stop.   Where can you learn more?  Go to https://www.bennett.info/ and enter H573 to learn more about "Substance Use Disorder: Care Instructions."  Current as of: March 21, 2023Content Version: 13.9   2006-2023 Healthwise, Incorporated.   Care instructions adapted under license by [facility]. If you have questions about a medical condition or  this instruction, always ask your healthcare professional. [place] any warranty or liability for your use of this information.           A Healthy Heart: Care Instructions  Your Care Instructions     Coronary artery disease, also called heart disease, occurs when a substance called plaque builds up in the vessels that supply oxygen-rich blood to your heart muscle. This can narrow the blood vessels and reduce blood flow. A heart attack happens when blood flow is completely blocked. A high-fat diet, smoking, and other factors increase the risk of heart disease.  Your doctor has found that you have a chance of having heart disease. You can do lots of things to keep your heart healthy. It may not be easy, but you can change your diet, exercise more, and quit smoking. These steps really work to lower your chance of heart disease.  Follow-up care is a key part of your treatment and safety. Be sure to make and go to all appointments, and call your doctor if you are having problems. It's also a good idea to know your test results and keep a list of the medicines you take.  How can you care for yourself at home?  Diet   Use less salt when you cook and eat. This helps lower your blood pressure. Taste food before salting. Add only a little salt when you think you need it. With time, your taste buds will adjust to less salt.    Eat fewer snack items, fast foods, canned soups, and other high-salt, high-fat, processed foods.    Read food labels and try to avoid saturated and trans fats. They increase your risk of heart disease by raising cholesterol levels.    Limit the amount of solid fat-butter, margarine, and shortening-you eat. Use olive, peanut, or canola oil when you cook. Bake, broil, and steam foods instead of frying them.    Eat a variety of fruit and vegetables every day. Dark green, deep orange, red, or yellow fruits and vegetables are especially good for you. Examples include spinach,  carrots, peaches, and berries.    Foods high in fiber can reduce your cholesterol and provide important vitamins and minerals. High-fiber foods include whole-grain cereals and breads, oatmeal, beans, brown rice, citrus fruits, and apples.    Eat lean proteins. Heart-healthy proteins include seafood, lean meats and poultry, eggs, beans, peas, nuts, seeds, and soy products.    Limit drinks and foods with added sugar. These include candy, desserts, and soda pop.   Lifestyle changes   If your doctor recommends it, get more exercise. Walking is a good choice. Bit by bit, increase the amount you walk every day. Try for  at least 30 minutes on most days of the week. You also may want to swim, bike, or do other activities.    Do not smoke. If you need help quitting, talk to your doctor about stop-smoking programs and medicines. These can increase your chances of quitting for good. Quitting smoking may be the most important step you can take to protect your heart. It is never too late to quit.    Limit alcohol to 2 drinks a day for men and 1 drink a day for women. Too much alcohol can cause health problems.    Manage other health problems such as diabetes, high blood pressure, and high cholesterol. If you think you may have a problem with alcohol or drug use, talk to your doctor.   Medicines   Take your medicines exactly as prescribed. Call your doctor if you think you are having a problem with your medicine.    If your doctor recommends aspirin, take the amount directed each day. Make sure you take aspirin and not another kind of pain reliever, such as acetaminophen (Tylenol).   When should you call for help?   Call 911 if you have symptoms of a heart attack. These may include:   Chest pain or pressure, or a strange feeling in the chest.    Sweating.    Shortness of breath.    Pain, pressure, or a strange feeling in the back, neck, jaw, or upper belly or in one or both shoulders or arms.    Lightheadedness or  sudden weakness.    A fast or irregular heartbeat.   After you call 911, the operator may tell you to chew 1 adult-strength or 2 to 4 low-dose aspirin. Wait for an ambulance. Do not try to drive yourself.  Watch closely for changes in your health, and be sure to contact your doctor if you have any problems.  Where can you learn more?  Go to https://www.bennett.info/ and enter F075 to learn more about "A Healthy Heart: Care Instructions."  Current as of: June 25, 2023Content Version: 13.9   2006-2023 Healthwise, Incorporated.   Care instructions adapted under license by [facility] If you have questions about a medical condition or this instruction, always ask your healthcare professional. [place] any warranty or liability for your use of this information.      Personalized Preventive Plan for Margaret Zimmerman - 04/12/2022  Medicare offers a range of preventive health benefits. Some of the tests and screenings are paid in full while other may be subject to a deductible, co-insurance, and/or copay.    Some of these benefits include a comprehensive review of your medical history including lifestyle, illnesses that may run in your family, and various assessments and screenings as appropriate.    After reviewing your medical record and screening and assessments performed today your provider may have ordered immunizations, labs, imaging, and/or referrals for you.  A list of these orders (if applicable) as well as your Preventive Care list are included within your After Visit Summary for your review.    Other Preventive Recommendations:    A preventive eye exam performed by an eye specialist is recommended every 1-2 years to screen for glaucoma; cataracts, macular degeneration, and other eye disorders.  A preventive dental visit is recommended every 6 months.  Try to get at least 150 minutes of exercise per week or 10,000 steps per day on a pedometer .  Order or download  the FREE "Exercise & Physical  Activity: Your Everyday Guide" from [employer] on Aging. Call 959-324-0133 or search [employer] on Aging online.  You need 1200-1500 mg of calcium and 1000-2000 IU of vitamin D per day. It is possible to meet your calcium requirement with diet alone, but a vitamin D supplement is usually necessary to meet this goal.  When exposed to the sun, use a sunscreen that protects against both UVA and UVB radiation with an SPF of 30 or greater. Reapply every 2 to 3 hours or after sweating, drying off with a towel, or swimming.  Always wear a seat belt when traveling in a car. Always wear a helmet when riding a bicycle or motorcycle.

## 2022-04-12 NOTE — Progress Notes (Signed)
Chief Complaint   Patient presents with    Depression    diverticular disease    Hypertension    Hypothyroidism    dermatits of the eye       HPI    New patient  She is here to establish care.  She reports that she is from [place] but lived in [place].  Shortly after returning to [place] she was hit in the face with a baseball at a baseball game. She reports back surgery x 2, the 2nd due to infection one week later    Hypothyroidism  She follows with Dr. Blair Hailey.    Health maintenance  She want to wit until her next visit to discuss a colonoscopy.  Vaccines were discussed.  She said that she had a Dexa scan    Leg swelling   Echo in 2018 with EF of 26-94%,  diastolic dysfunction.  She said that it is on and off but was worse when she was on Norvasc.    Dermatitis  Two weeks ago eye lashes lifted and tinted.  Her right eye has been red and itching    Abnormality to right great toe nail bed  The area is on the right great toe.  She had a prior area on the left fifth toe but it grew out.  Discussed referral to Podiatry or Dermatology if no improvement.     There is no problem list on file for this patient.      Past Medical History:   Diagnosis Date    Bipolar 1 disorder ([place])     Diverticular disease     Hypertension     Hypothyroidism     Spinal stenosis        Past Surgical History:   Procedure Laterality Date    APPENDECTOMY      BACK SURGERY      CHOLECYSTECTOMY      COLONOSCOPY      HYSTERECTOMY (CERVIX STATUS UNKNOWN)      KNEE SURGERY      torn meninscus    LAP BAND      LUMBAR LAMINECTOMY      OVARY REMOVAL         Current Outpatient Medications   Medication Sig Dispense Refill    levothyroxine (SYNTHROID) 125 MCG tablet Take 2 tablets by mouth Daily      Cholecalciferol (VITAMIN D3) 1000 units CAPS Take 1,000 Units by mouth daily      Multiple Vitamin (MULTIVITAMIN ADULT PO) Take 1 capsule by mouth daily      B-Complex TABS Take 1 tablet by mouth daily      vitamin B-6 (PYRIDOXINE) 100 MG tablet Take 1 tablet  by mouth daily      valsartan (DIOVAN) 320 MG tablet Take 1 tablet by mouth daily      buPROPion (WELLBUTRIN XL) 150 MG extended release tablet Take 1 tablet by mouth every morning      lamoTRIgine (LAMICTAL) 200 MG tablet Take 1 tablet by mouth daily      QUEtiapine Fumarate 150 MG TABS Take 150 mg by mouth at bedtime      calcium carbonate (OSCAL) 500 MG TABS tablet Take 1 tablet by mouth daily      zinc sulfate (ZINCATE) 220 (50 Zn) MG capsule Take 1 capsule by mouth daily      ALPRAZolam (XANAX) 1 MG tablet Take 1 tablet by mouth daily as needed for Sleep. 2 a day as needed  ascorbic acid (VITAMIN C) 500 MG tablet Take 1 tablet by mouth daily       No current facility-administered medications for this visit.       No Known Allergies    Social History     Socioeconomic History    Marital status: Divorced     Spouse name: None    Number of children: None    Years of education: None    Highest education level: None   Tobacco Use    Smoking status: Former     Types: Cigarettes    Smokeless tobacco: Never    Tobacco comments:     Smoked on and off.  Haven't smoked for about 20 years   Vaping Use    Vaping Use: Every day    Substances: THC   Substance and Sexual Activity    Alcohol use: Yes     Comment: wine daily    Drug use: Yes     Types: Marijuana Sherrie Mustache)     Comment: THC/medical marijuana card    Sexual activity: Not Currently     Partners: Male     Social Determinants of Health     Physical Activity: Inactive (04/12/2022)    Exercise Vital Sign     Days of Exercise per Week: 0 days     Minutes of Exercise per Session: 0 min       Family History   Problem Relation Age of Onset    Amyotrophic lateral sclerosis Mother     Thyroid Disease Father     Gout Father     Stroke Father     Cancer Sister         throat, esophagus, tongue, vaginal    Hemochromatosis Sister     Leukemia Brother     Hypertension Daughter     Thyroid Disease Daughter     Hemochromatosis Son     Liver Disease Son     Gout Son           Review  of Systems   Constitutional:  Negative for activity change and fatigue.   HENT:  Negative for congestion, postnasal drip and sore throat.         Right eye dermatitis   Eyes:  Negative for redness and visual disturbance.   Respiratory:  Negative for shortness of breath and wheezing.    Cardiovascular:  Negative for chest pain and leg swelling.   Gastrointestinal:  Negative for abdominal pain, constipation and diarrhea.   Genitourinary:  Negative for dysuria, flank pain and frequency.   Musculoskeletal:  Negative for arthralgias, back pain and myalgias.   Skin:  Negative for color change and rash.        Abnormal area to right great toe nail bed   Neurological:  Negative for dizziness and headaches.   Psychiatric/Behavioral:  Negative for sleep disturbance. The patient is not nervous/anxious.        BP 132/82 (Site: Left Upper Arm, Position: Sitting)   Pulse 79   Temp 97.2 F (36.2 C) (Temporal)   Resp 18   Ht 1.575 m (5\' 2" )   Wt 87.1 kg (192 lb)   SpO2 96%   BMI 35.12 kg/m     BP Readings from Last 3 Encounters:   04/12/22 132/82   04/12/22 132/82   08/02/19 (!) 170/86       Wt Readings from Last 3 Encounters:   04/12/22 87.1 kg (192 lb)   04/12/22 87.1 kg (192 lb)  08/02/19 108.9 kg (240 lb)       Physical Exam  Constitutional:       General: She is awake.      Appearance: Normal appearance.   HENT:      Head: Normocephalic and atraumatic.      Right Ear: External ear normal.      Left Ear: External ear normal.      Nose: Nose normal.      Mouth/Throat:      Mouth: Mucous membranes are moist.   Eyes:      General: Lids are normal.      Extraocular Movements: Extraocular movements intact.      Conjunctiva/sclera: Conjunctivae normal.      Pupils: Pupils are equal, round, and reactive to light.      Comments: Right eye dermatitis   Neck:      Trachea: Phonation normal.   Cardiovascular:      Rate and Rhythm: Normal rate and regular rhythm.      Pulses: Normal pulses.      Heart sounds: Normal heart sounds.    Pulmonary:      Effort: Pulmonary effort is normal.      Breath sounds: Normal breath sounds.   Abdominal:      General: Bowel sounds are normal.      Palpations: Abdomen is soft.   Musculoskeletal:         General: Normal range of motion.      Cervical back: Normal range of motion and neck supple.      Right lower leg: No edema.      Left lower leg: No edema.   Feet:      Right foot:      Skin integrity: Skin integrity normal.      Left foot:      Skin integrity: Skin integrity normal.   Skin:     General: Skin is warm and dry.      Capillary Refill: Capillary refill takes less than 2 seconds.      Comments: Abnormal area to right great toe nail bed   Neurological:      General: No focal deficit present.      Mental Status: She is alert and oriented to person, place, and time.   Psychiatric:         Attention and Perception: Attention and perception normal.         Mood and Affect: Mood normal.         Speech: Speech normal.         Behavior: Behavior normal. Behavior is cooperative.         Thought Content: Thought content normal.         Cognition and Memory: Cognition normal.         Judgment: Judgment normal.         CBC with Differential:    Lab Results   Component Value Date/Time    WBC 7.3 12/09/2019 03:50 AM    RBC 3.74 12/09/2019 03:50 AM    HGB 12.2 12/09/2019 03:50 AM    HCT 38.9 12/09/2019 03:50 AM    PLT 151 12/09/2019 03:50 AM    MCV 104.0 12/09/2019 03:50 AM    MCH 32.6 12/09/2019 03:50 AM    MCHC 31.4 12/09/2019 03:50 AM    RDW 13.9 12/09/2019 03:50 AM     CMP:    Lab Results   Component Value Date/Time    NA 133 12/09/2019 03:50 AM  K 4.1 12/09/2019 03:50 AM    CL 99 12/09/2019 03:50 AM    CO2 24 12/09/2019 03:50 AM    BUN 9 12/09/2019 03:50 AM    CREATININE 1.0 12/09/2019 03:50 AM    GFRAA >60 12/09/2019 03:50 AM    LABGLOM 55 12/09/2019 03:50 AM    GLUCOSE 86 12/09/2019 03:50 AM    CALCIUM 9.0 12/09/2019 03:50 AM        There is no problem list on file for this  patient.      ASSESSMENT/PLAN:    1. Encounter to establish care    2. Annual physical exam  - CBC with Auto Differential; Future  - Comprehensive Metabolic Panel; Future  - Lipid Panel; Future  - Vitamin B12 & Folate; Future    3. Hypothyroidism, unspecified type  - T4, Free; Future  - TSH; Future    4. Dermatitis contact, eyelid, right    5. Macrocytosis  - CBC with Auto Differential; Future  - Iron and TIBC; Future  - Ferritin; Future    6. Breast cancer screening by mammogram  - MAM DIGITAL SCREEN BILATERAL PER PROTOCOL; Future    7. Need for hepatitis C screening test  - Hepatitis Panel, Acute; Future    8. Sinus drainage  - [place], [place], DO, Otolaryngology, [place]    9. Vitamin D deficiency  - Vitamin D 25 Hydroxy; Future    10. Abnormal finding of blood chemistry, unspecified  - Iron and TIBC; Future  - Ferritin; Future    11. IFG (impaired fasting glucose)  - Hemoglobin A1C; Future    Follow up in 3 months      [name], APRN - CNP, MSN, NP-C  04/12/22  11:48 AM EST

## 2022-04-13 LAB — CBC WITH AUTO DIFFERENTIAL
Absolute Immature Granulocyte: <0.03 10*3/uL (ref 0.00–0.58)
Basophils %: 1 % (ref 0.0–2.0)
Basophils Absolute: 0.07 10*3/uL (ref 0.00–0.20)
Eosinophils %: 3 % (ref 0–6)
Eosinophils Absolute: 0.15 10*3/uL (ref 0.05–0.50)
Hematocrit: 42.7 % (ref 34.0–48.0)
Hemoglobin: 14.1 g/dL (ref 11.5–15.5)
Immature Granulocytes: 0 % (ref 0.0–5.0)
Lymphocytes %: 41 % (ref 20.0–42.0)
Lymphocytes Absolute: 2.17 10*3/uL (ref 1.50–4.00)
MCH: 35.3 pg — ABNORMAL HIGH (ref 26.0–35.0)
MCHC: 33 g/dL (ref 32.0–34.5)
MCV: 106.8 fL — ABNORMAL HIGH (ref 80.0–99.9)
MPV: 10.5 fL (ref 7.0–12.0)
Monocytes %: 9 % (ref 2.0–12.0)
Monocytes Absolute: 0.49 10*3/uL (ref 0.10–0.95)
Neutrophils %: 45 % (ref 43.0–80.0)
Neutrophils Absolute: 2.39 10*3/uL (ref 1.80–7.30)
Platelets: 155 10*3/uL (ref 130–450)
RBC: 4 m/uL (ref 3.50–5.50)
RDW: 12 % (ref 11.5–15.0)
WBC: 5.3 10*3/uL (ref 4.5–11.5)

## 2022-04-13 LAB — COMPREHENSIVE METABOLIC PANEL
ALT: 36 U/L — ABNORMAL HIGH (ref 0–32)
AST: 51 U/L — ABNORMAL HIGH (ref 0–31)
Albumin: 4.5 g/dL (ref 3.5–5.2)
Alkaline Phosphatase: 126 U/L — ABNORMAL HIGH (ref 35–104)
Anion Gap: 13 mmol/L (ref 7–16)
BUN: 23 mg/dL (ref 6–23)
CO2: 27 mmol/L (ref 22–29)
Calcium: 10.3 mg/dL — ABNORMAL HIGH (ref 8.6–10.2)
Chloride: 95 mmol/L — ABNORMAL LOW (ref 98–107)
Creatinine: 1 mg/dL (ref 0.50–1.00)
Est, Glom Filt Rate: >60 mL/min/{1.73_m2} (ref >60)
Glucose: 90 mg/dL (ref 74–99)
Potassium: 4.9 mmol/L (ref 3.5–5.0)
Sodium: 135 mmol/L (ref 132–146)
Total Bilirubin: 0.5 mg/dL (ref 0.0–1.2)
Total Protein: 6.8 g/dL (ref 6.4–8.3)

## 2022-04-13 LAB — LIPID PANEL
Cholesterol: 182 mg/dL (ref <200)
HDL: 111 mg/dL (ref >40)
LDL Cholesterol: 49 mg/dL (ref <100)
Triglycerides: 112 mg/dL (ref <150)
VLDL: 22 mg/dL

## 2022-04-13 LAB — VITAMIN B12 & FOLATE
Folate: >20 ng/mL (ref 4.8–24.2)
Vitamin B-12: 818 pg/mL (ref 211–946)

## 2022-04-13 LAB — FERRITIN: Ferritin: 476 ng/mL

## 2022-04-13 LAB — IRON AND TIBC
Iron % Saturation: 38 % (ref 15–50)
Iron: 113 ug/dL (ref 37–145)
TIBC: 300 ug/dL (ref 250–450)

## 2022-04-13 LAB — TSH: TSH: 1.42 u[IU]/mL (ref 0.27–4.20)

## 2022-04-13 LAB — HEPATITIS PANEL, ACUTE
Hep A IgM: NONREACTIVE
Hep B Core Ab, IgM: NONREACTIVE
Hepatitis B Surface Ag: NONREACTIVE
Hepatitis C Ab: NONREACTIVE

## 2022-04-13 LAB — VITAMIN D 25 HYDROXY: Vit D, 25-Hydroxy: 89.4 ng/mL (ref 30.0–100.0)

## 2022-04-13 LAB — HEMOGLOBIN A1C: Hemoglobin A1C: 4.7 % (ref 4.0–5.6)

## 2022-04-13 LAB — T4, FREE: T4 Free: 1.4 ng/dL (ref 0.9–1.7)

## 2022-04-23 ENCOUNTER — Encounter

## 2022-04-23 NOTE — Telephone Encounter (Signed)
Lab results called to patient.  Blood work ordered.

## 2022-05-17 ENCOUNTER — Encounter

## 2022-05-18 LAB — COMPREHENSIVE METABOLIC PANEL
ALT: 30 U/L (ref 0–32)
AST: 40 U/L — ABNORMAL HIGH (ref 0–31)
Albumin: 4.3 g/dL (ref 3.5–5.2)
Alkaline Phosphatase: 104 U/L (ref 35–104)
Anion Gap: 15 mmol/L (ref 7–16)
BUN: 13 mg/dL (ref 6–23)
CO2: 23 mmol/L (ref 22–29)
Calcium: 10.2 mg/dL (ref 8.6–10.2)
Chloride: 99 mmol/L (ref 98–107)
Creatinine: 1 mg/dL (ref 0.50–1.00)
Est, Glom Filt Rate: >60 mL/min/{1.73_m2} (ref >60)
Glucose: 97 mg/dL (ref 74–99)
Potassium: 4.6 mmol/L (ref 3.5–5.0)
Sodium: 137 mmol/L (ref 132–146)
Total Bilirubin: 0.4 mg/dL (ref 0.0–1.2)
Total Protein: 6.4 g/dL (ref 6.4–8.3)

## 2022-05-18 LAB — CBC WITH AUTO DIFFERENTIAL
Absolute Immature Granulocyte: <0.03 10*3/uL (ref 0.00–0.58)
Basophils %: 1 % (ref 0.0–2.0)
Basophils Absolute: 0.04 10*3/uL (ref 0.00–0.20)
Eosinophils %: 2 % (ref 0–6)
Eosinophils Absolute: 0.07 10*3/uL (ref 0.05–0.50)
Hematocrit: 37.9 % (ref 34.0–48.0)
Hemoglobin: 12.5 g/dL (ref 11.5–15.5)
Immature Granulocytes: 0 % (ref 0.0–5.0)
Lymphocytes %: 48 % — ABNORMAL HIGH (ref 20.0–42.0)
Lymphocytes Absolute: 1.73 10*3/uL (ref 1.50–4.00)
MCH: 34.4 pg (ref 26.0–35.0)
MCHC: 33 g/dL (ref 32.0–34.5)
MCV: 104.4 fL — ABNORMAL HIGH (ref 80.0–99.9)
MPV: 10 fL (ref 7.0–12.0)
Monocytes %: 9 % (ref 2.0–12.0)
Monocytes Absolute: 0.32 10*3/uL (ref 0.10–0.95)
Neutrophils %: 40 % — ABNORMAL LOW (ref 43.0–80.0)
Neutrophils Absolute: 1.46 10*3/uL — ABNORMAL LOW (ref 1.80–7.30)
Platelets: 156 10*3/uL (ref 130–450)
RBC: 3.63 m/uL (ref 3.50–5.50)
RDW: 12.3 % (ref 11.5–15.0)
WBC: 3.6 10*3/uL — ABNORMAL LOW (ref 4.5–11.5)

## 2022-05-18 LAB — PERIPHERAL BLOOD SMEAR, PATH REVIEW

## 2022-05-18 LAB — PROTIME-INR
INR: 1
Protime: 11.3 s (ref 9.3–12.4)

## 2022-05-18 LAB — IRON AND TIBC
Iron % Saturation: 31 % (ref 15–50)
Iron: 87 ug/dL (ref 37–145)
TIBC: 284 ug/dL (ref 250–450)

## 2022-05-18 LAB — VITAMIN B12 & FOLATE
Folate: >20 ng/mL (ref 4.8–24.2)
Vitamin B-12: 716 pg/mL (ref 211–946)

## 2022-05-18 LAB — CALCIUM, IONIZED: Calcium, Ionized: 1.27 mmol/L (ref 1.15–1.33)

## 2022-05-18 LAB — PTH, INTACT: Pth Intact: 46.4 pg/mL (ref 15–65)

## 2022-05-18 LAB — FERRITIN: Ferritin: 313 ng/mL

## 2022-05-21 MED ORDER — VALSARTAN 320 MG PO TABS
320 MG | ORAL_TABLET | Freq: Every day | ORAL | 5 refills | Status: DC
Start: 2022-05-21 — End: 2022-12-17

## 2022-06-05 ENCOUNTER — Encounter: Admit: 2022-06-05 | Discharge: 2022-06-05 | Payer: MEDICARE | Attending: Family | Primary: Family

## 2022-06-05 DIAGNOSIS — E8770 Fluid overload, unspecified: Secondary | ICD-10-CM

## 2022-06-05 MED ORDER — AMOXICILLIN-POT CLAVULANATE 875-125 MG PO TABS
875-125 | ORAL_TABLET | Freq: Two times a day (BID) | ORAL | 0 refills | Status: AC
Start: 2022-06-05 — End: 2022-06-15

## 2022-06-05 MED ORDER — TORSEMIDE 20 MG PO TABS
20 MG | ORAL_TABLET | Freq: Every day | ORAL | 0 refills | Status: AC
Start: 2022-06-05 — End: 2022-06-14

## 2022-06-05 NOTE — Progress Notes (Signed)
Chief Complaint   Patient presents with    Leg Swelling    Congestion    Shortness of Breath    Hypertension    Weight Management    macrocytosis    Palpitations    neutropenia    elevated liver enzynes         An electronic signature was used to authenticate this note. Margaret Zimmerman, was evaluated through a synchronous (real-time) audio-video encounter. The patient (or guardian if applicable) is aware that this is a billable service, which includes applicable co-pays. This Virtual Visit was conducted with patient's (and/or legal guardian's) consent. Patient identification was verified, and a caregiver was present when appropriate.   The patient was located at Home: [place] [address]  Provider was located at [facility] ([place]): [place] [address]  Confirm you are appropriately licensed, registered, or certified to deliver care in the state where the patient is located as indicated above. If you are not or unsure, please re-schedule the visit: Yes, I confirm.      Total time spent for this encounter: Not billed by time    Patient does agree to proceed with telemedicine consultation.    --[name], APRN - CNP on 06/05/2022 at 10:30 PM    An electronic signature was used to authenticate this note.    HPI    URI  Sore throat, runny nose, tickle in throat, congestion  Came back from a cruise, [place], [place]    Hypertension  She is on valsartan    Leg swelling  Swelling in feet and ankles    Bipolar  Currently on Wellbutrin and lamictal  Off Seroquel - no more tremors in hands    Diastolic dysfunction  Shortness of breath  Her weight is up to 200 lbs   Heart rate goes over 100, then heart rate goes down with sitting  She had taken Lasix before and her sodium dropped.  Echo in 2018 with EF of 123456,  diastolic dysfunction.     Hypothyroidism  She follows with Dr. Blair Hailey.     Obesity  She is considering Contrave but will check with her insurance first.    Patient  Active Problem List   Diagnosis    Bipolar I disorder ([place])    Chronic diastolic CHF (congestive heart failure) ([place])    Edema    Essential hypertension       Past Medical History:   Diagnosis Date    Bipolar 1 disorder ([facility])     Diverticular disease     Hypertension     Hypothyroidism     Spinal stenosis        Past Surgical History:   Procedure Laterality Date    APPENDECTOMY      BACK SURGERY      CHOLECYSTECTOMY      COLONOSCOPY      HYSTERECTOMY (CERVIX STATUS UNKNOWN)      KNEE SURGERY      torn meninscus    LAP BAND      LUMBAR LAMINECTOMY      OVARY REMOVAL         Current Outpatient Medications   Medication Sig Dispense Refill    torsemide (DEMADEX) 20 MG tablet Take 1 tablet by mouth daily 3 tablet 0    amoxicillin-clavulanate (AUGMENTIN) 875-125 MG per tablet Take 1 tablet by mouth 2 times daily for 10 days 20 tablet 0  valsartan (DIOVAN) 320 MG tablet Take 1 tablet by mouth daily 30 tablet 5    levothyroxine (SYNTHROID) 125 MCG tablet Take 2 tablets by mouth Daily      Cholecalciferol (VITAMIN D3) 1000 units CAPS Take 1,000 Units by mouth daily      Multiple Vitamin (MULTIVITAMIN ADULT PO) Take 1 capsule by mouth daily      B-Complex TABS Take 1 tablet by mouth daily      vitamin B-6 (PYRIDOXINE) 100 MG tablet Take 1 tablet by mouth daily      buPROPion (WELLBUTRIN XL) 150 MG extended release tablet Take 1 tablet by mouth every morning      lamoTRIgine (LAMICTAL) 200 MG tablet Take 1 tablet by mouth daily      QUEtiapine Fumarate 150 MG TABS Take 150 mg by mouth at bedtime      calcium carbonate (OSCAL) 500 MG TABS tablet Take 1 tablet by mouth daily      zinc sulfate (ZINCATE) 220 (50 Zn) MG capsule Take 1 capsule by mouth daily      ALPRAZolam (XANAX) 1 MG tablet Take 1 tablet by mouth daily as needed for Sleep. 2 a day as needed      ascorbic acid (VITAMIN C) 500 MG tablet Take 1 tablet by mouth daily       No current facility-administered medications for this visit.       No Known  Allergies    Social History     Socioeconomic History    Marital status: Divorced     Spouse name: None    Number of children: None    Years of education: None    Highest education level: None   Tobacco Use    Smoking status: Former     Types: Cigarettes    Smokeless tobacco: Never    Tobacco comments:     Smoked on and off.  Haven't smoked for about 20 years   Vaping Use    Vaping Use: Every day    Substances: THC   Substance and Sexual Activity    Alcohol use: Yes     Comment: wine daily    Drug use: Yes     Types: Marijuana Sherrie Mustache)     Comment: THC/medical marijuana card    Sexual activity: Not Currently     Partners: Male     Social Determinants of Health     Physical Activity: Inactive (04/12/2022)    Exercise Vital Sign     Days of Exercise per Week: 0 days     Minutes of Exercise per Session: 0 min       Family History   Problem Relation Age of Onset    Amyotrophic lateral sclerosis Mother     Thyroid Disease Father     Gout Father     Stroke Father     Cancer Sister         throat, esophagus, tongue, vaginal    Hemochromatosis Sister     Leukemia Brother     Hypertension Daughter     Thyroid Disease Daughter     Hemochromatosis Son     Liver Disease Son     Gout Son           Review of Systems   Constitutional:  Negative for activity change and fatigue.   HENT:  Positive for congestion, postnasal drip, sinus pressure, sinus pain and sore throat.    Eyes:  Negative for redness and visual disturbance.  Respiratory:  Positive for cough and shortness of breath. Negative for wheezing.    Cardiovascular:  Positive for palpitations. Negative for chest pain and leg swelling.   Gastrointestinal:  Negative for abdominal pain, constipation and diarrhea.   Genitourinary:  Negative for dysuria, flank pain and frequency.   Musculoskeletal:  Negative for arthralgias, back pain and myalgias.   Skin:  Negative for color change and rash.   Neurological:  Negative for dizziness and headaches.   Psychiatric/Behavioral:  Negative  for sleep disturbance. The patient is not nervous/anxious.        There were no vitals taken for this visit.    BP Readings from Last 3 Encounters:   04/12/22 132/82   04/12/22 132/82   08/02/19 (!) 170/86       Wt Readings from Last 3 Encounters:   04/12/22 87.1 kg (192 lb)   04/12/22 87.1 kg (192 lb)   08/02/19 108.9 kg (240 lb)       Physical Exam - No done due to physical exam    CBC with Differential:    Lab Results   Component Value Date/Time    WBC 3.6 05/17/2022 11:19 AM    RBC 3.63 05/17/2022 11:19 AM    HGB 12.5 05/17/2022 11:19 AM    HCT 37.9 05/17/2022 11:19 AM    PLT 156 05/17/2022 11:19 AM    MCV 104.4 05/17/2022 11:19 AM    MCH 34.4 05/17/2022 11:19 AM    MCHC 33.0 05/17/2022 11:19 AM    RDW 12.3 05/17/2022 11:19 AM    LYMPHOPCT 48 05/17/2022 11:19 AM    MONOPCT 9 05/17/2022 11:19 AM    BASOPCT 1 05/17/2022 11:19 AM    MONOSABS 0.32 05/17/2022 11:19 AM    LYMPHSABS 1.73 05/17/2022 11:19 AM    EOSABS 0.07 05/17/2022 11:19 AM    BASOSABS 0.04 05/17/2022 11:19 AM     CMP:    Lab Results   Component Value Date/Time    NA 137 05/17/2022 11:19 AM    K 4.6 05/17/2022 11:19 AM    CL 99 05/17/2022 11:19 AM    CO2 23 05/17/2022 11:19 AM    BUN 13 05/17/2022 11:19 AM    CREATININE 1.0 05/17/2022 11:19 AM    GFRAA >60 12/09/2019 03:50 AM    LABGLOM >60 05/17/2022 11:19 AM    GLUCOSE 97 05/17/2022 11:19 AM    PROT 6.4 05/17/2022 11:19 AM    LABALBU 4.3 05/17/2022 11:19 AM    CALCIUM 10.2 05/17/2022 11:19 AM    BILITOT 0.4 05/17/2022 11:19 AM    ALKPHOS 104 05/17/2022 11:19 AM    AST 40 05/17/2022 11:19 AM    ALT 30 05/17/2022 11:19 AM        Patient Active Problem List   Diagnosis    Bipolar I disorder ([facility])    Chronic diastolic CHF (congestive heart failure) ([facility])    Edema    Essential hypertension       ASSESSMENT/PLAN:    1. Hypervolemia, unspecified hypervolemia type  - XR CHEST STANDARD (2 VW); Future  - [place] DUP LOWER EXTREMITIES BILATERAL VENOUS; Future  - Comprehensive Metabolic Panel; Future  - [place] -  Claudean Kinds, MD, Cardiology, [facility]  - torsemide [facility]) 20 MG tablet; Take 1 tablet by mouth daily  Dispense: 3 tablet; Refill: 0    2. Palpitation  - [place] - Claudean Kinds, MD, Cardiology, [facility]    3. Leg swelling  - [place] DUP LOWER EXTREMITIES BILATERAL VENOUS;  Future    4. Neutropenia, unspecified type [facility])  - [place] - [place]alene Bathe, MD, Hematology and Oncology, [facility]    5. Macrocytosis  - [place] - [place] Ralene Bathe, MD, Hematology and Oncology, [facility]    6. Elevated liver enzymes  - [place] ABDOMEN LIMITED; Future    7. Upper respiratory tract infection, unspecified type    8. Class 2 obesity without serious comorbidity with body mass index (BMI) of 35.0 to 35.9 in adult, unspecified obesity type      Keep scheduled follow up    [name], APRN - CNP  06/05/22  10:51 AM EDT

## 2022-06-06 NOTE — Telephone Encounter (Signed)
Pt called to notified office that Contrave is not covered by her insurance  Vilinda Boehringer was notified  Will keep fu appt in June

## 2022-06-07 ENCOUNTER — Ambulatory Visit: Admit: 2022-06-07 | Payer: MEDICARE | Primary: Family

## 2022-06-07 ENCOUNTER — Encounter

## 2022-06-07 DIAGNOSIS — E8770 Fluid overload, unspecified: Secondary | ICD-10-CM

## 2022-06-08 ENCOUNTER — Encounter

## 2022-06-08 LAB — COMPREHENSIVE METABOLIC PANEL
ALT: 22 U/L (ref 0–32)
AST: 28 U/L (ref 0–31)
Albumin: 4.4 g/dL (ref 3.5–5.2)
Alkaline Phosphatase: 113 U/L — ABNORMAL HIGH (ref 35–104)
Anion Gap: 14 mmol/L (ref 7–16)
BUN: 16 mg/dL (ref 6–23)
CO2: 24 mmol/L (ref 22–29)
Calcium: 9.8 mg/dL (ref 8.6–10.2)
Chloride: 98 mmol/L (ref 98–107)
Creatinine: 1 mg/dL (ref 0.50–1.00)
Est, Glom Filt Rate: 61 mL/min/{1.73_m2} (ref >60)
Glucose: 95 mg/dL (ref 74–99)
Potassium: 4.7 mmol/L (ref 3.5–5.0)
Sodium: 136 mmol/L (ref 132–146)
Total Bilirubin: 0.5 mg/dL (ref 0.0–1.2)
Total Protein: 6.7 g/dL (ref 6.4–8.3)

## 2022-06-11 ENCOUNTER — Encounter

## 2022-06-11 ENCOUNTER — Encounter: Admit: 2022-06-11 | Discharge: 2022-06-11 | Payer: MEDICARE | Attending: Family | Primary: Family

## 2022-06-11 DIAGNOSIS — K219 Gastro-esophageal reflux disease without esophagitis: Secondary | ICD-10-CM

## 2022-06-11 MED ORDER — OMEPRAZOLE 40 MG PO CPDR
40 | ORAL_CAPSULE | Freq: Every day | ORAL | 0 refills | Status: DC
Start: 2022-06-11 — End: 2022-06-12

## 2022-06-11 MED ORDER — ALUM HYDROXIDE-MAG CARBONATE 95-358 MG/15ML PO SUSP
95-358 | Freq: Every evening | ORAL | 1 refills | Status: AC
Start: 2022-06-11 — End: 2022-07-11

## 2022-06-11 NOTE — Progress Notes (Signed)
[facility]  Dr. Jilda Panda D. Bunevich,  D.O. Ms.Ed.  New Consult       Patient Name:  Margaret Zimmerman  DOB:  08-29-1950     CHIEF C/O:    Chief Complaint   Patient presents with    New Patient     Phlegm in throat worse in morning. No headache or sore throat. Pt was on atb last week for a sinus infection, Has a few more doses left.        HISTORY OBTAINED FROM:  patient    HISTORY OF PRESENT ILLNESS:       Margaret Zimmerman is a 72 y.o. year old female, here today for:       Mucus in throat and globus sensation.  Patient states for the past several months she has had frequent sensation of mucus in her throat that is worse in the morning but does feel it throughout the day.  With this she has frequent throat clearing as well as hoarseness of her voice.  She denies any significant congestion, rhinorrhea, or postnasal drainage.  She denies any sinus pain or pressure.  She denies any recent fevers or recent antibiotics.  She does have a history of previous sinus surgery including a deviated septum repair.  Also had a UP3 for sleep apnea.  She denies any noticeable acid reflux symptoms and denies any acid reflux medication.  She does have a history of Lap-Band surgery with acid reflux following the surgery.  She denies any sore throat or difficulty swallowing but does note a globus sensation.           Past Medical History:   Diagnosis Date    Bipolar 1 disorder (HCC)     Diverticular disease     Hypertension     Hypothyroidism     Spinal stenosis      Past Surgical History:   Procedure Laterality Date    APPENDECTOMY      BACK SURGERY      CHOLECYSTECTOMY      COLONOSCOPY      HYSTERECTOMY (CERVIX STATUS UNKNOWN)      KNEE SURGERY      torn meninscus    LAP BAND      LUMBAR LAMINECTOMY      OVARY REMOVAL         Current Outpatient Medications:     omeprazole (PRILOSEC) 40 MG delayed release capsule, Take 1 capsule by mouth daily, Disp: 30 capsule, Rfl: 0    aluminum hydroxide-magnesium carbonate (GAVISCON) 95-358 MG/15ML  suspension, Take 15 mLs by mouth nightly, Disp: 450 mL, Rfl: 1    torsemide (DEMADEX) 20 MG tablet, Take 1 tablet by mouth daily, Disp: 3 tablet, Rfl: 0    amoxicillin-clavulanate (AUGMENTIN) 875-125 MG per tablet, Take 1 tablet by mouth 2 times daily for 10 days, Disp: 20 tablet, Rfl: 0    valsartan (DIOVAN) 320 MG tablet, Take 1 tablet by mouth daily, Disp: 30 tablet, Rfl: 5    levothyroxine (SYNTHROID) 125 MCG tablet, Take 2 tablets by mouth Daily, Disp: , Rfl:     Cholecalciferol (VITAMIN D3) 1000 units CAPS, Take 1 capsule by mouth daily, Disp: , Rfl:     Multiple Vitamin (MULTIVITAMIN ADULT PO), Take 1 capsule by mouth daily, Disp: , Rfl:     B-Complex TABS, Take 1 tablet by mouth daily, Disp: , Rfl:     vitamin B-6 (PYRIDOXINE) 100 MG tablet, Take 1 tablet by mouth daily, Disp: , Rfl:  buPROPion (WELLBUTRIN XL) 150 MG extended release tablet, Take 1 tablet by mouth every morning, Disp: , Rfl:     lamoTRIgine (LAMICTAL) 200 MG tablet, Take 1 tablet by mouth daily, Disp: , Rfl:     zinc sulfate (ZINCATE) 220 (50 Zn) MG capsule, Take 1 capsule by mouth daily, Disp: , Rfl:     ALPRAZolam (XANAX) 1 MG tablet, Take 1 tablet by mouth daily as needed for Sleep. 2 a day as needed, Disp: , Rfl:     ascorbic acid (VITAMIN C) 500 MG tablet, Take 1 tablet by mouth daily, Disp: , Rfl:   Patient has no known allergies.  Social History     Tobacco Use    Smoking status: Former     Types: Cigarettes    Smokeless tobacco: Never    Tobacco comments:     Smoked on and off.  Haven't smoked for about 20 years   Vaping Use    Vaping Use: Every day    Substances: THC   Substance Use Topics    Alcohol use: Yes     Comment: wine daily    Drug use: Yes     Types: Marijuana Sheran Fava)     Comment: THC/medical marijuana card     Family History   Problem Relation Age of Onset    Amyotrophic lateral sclerosis Mother     Thyroid Disease Father     Gout Father     Stroke Father     Cancer Sister         throat, esophagus, tongue, vaginal     Hemochromatosis Sister     Leukemia Brother     Hypertension Daughter     Thyroid Disease Daughter     Hemochromatosis Son     Liver Disease Son     Gout Son        Review of Systems   Constitutional: Negative.  Negative for activity change and appetite change.   HENT:  Positive for congestion, postnasal drip and voice change (hoarseness). Negative for ear discharge, ear pain, hearing loss, rhinorrhea, sinus pressure, sinus pain and trouble swallowing.    Eyes: Negative.    Respiratory: Negative.  Negative for shortness of breath and stridor.    Cardiovascular: Negative.  Negative for chest pain and palpitations.   Endocrine: Negative.    Musculoskeletal: Negative.    Skin: Negative.    Neurological: Negative.  Negative for dizziness.   Hematological: Negative.    Psychiatric/Behavioral: Negative.         BP (!) 170/98 (Site: Left Upper Arm, Position: Sitting, Cuff Size: Medium Adult)   Pulse 74   Wt 90.5 kg (199 lb 9.6 oz)   BMI 36.51 kg/m   Physical Exam  Constitutional:       Appearance: Normal appearance.   HENT:      Head: Normocephalic.      Right Ear: Tympanic membrane, ear canal and external ear normal.      Left Ear: Tympanic membrane, ear canal and external ear normal.      Nose: No rhinorrhea.      Right Nostril: No occlusion.      Left Nostril: No occlusion.      Right Turbinates: Not pale.      Left Turbinates: Not pale.      Mouth/Throat:      Lips: Pink.      Mouth: Mucous membranes are moist.      Pharynx: Oropharynx is clear.  Eyes:      Conjunctiva/sclera: Conjunctivae normal.      Pupils: Pupils are equal, round, and reactive to light.   Cardiovascular:      Rate and Rhythm: Normal rate and regular rhythm.      Pulses: Normal pulses.   Pulmonary:      Effort: Pulmonary effort is normal. No respiratory distress.      Breath sounds: No stridor.   Musculoskeletal:         General: Normal range of motion.      Cervical back: Normal range of motion. No rigidity. No muscular tenderness.   Skin:      General: Skin is warm and dry.   Neurological:      General: No focal deficit present.      Mental Status: She is alert and oriented to person, place, and time.   Psychiatric:         Mood and Affect: Mood normal.         Behavior: Behavior normal.         Thought Content: Thought content normal.         Judgment: Judgment normal.         IMPRESSION/PLAN:    Endoscopy Procedure Note    Pre-operative Diagnosis: Globus sensation, hoarseness    Post-operative Diagnosis: normal, same, LPR    Indications: Hoarseness, dysphagia or aspiration - not able to be clearly evaluated by indirect laryngoscopy  Evaluation of the larynx and immediate subglottis - unable to be visualized by mirror examination    Anesthesia: Lidocaine 4% and Neo-Synephrine 1/2%    Endoscopy Type:  laryngoscopy    Procedure Details   With the patient sitting upright in the examining chair informed consent was obtained.  The left side(s) of the nose was topically anesthetized with spray.  After waiting an appropriate period of time for anesthesia/ vasoconstriction to become effective, the flexible fiberoptic  flexible laryngoscope was passed through the left side(s) of the nose, and the nose, nasopharynx, oropharynx, hypopharynx and larynx were examined.  An identical procedure was performed on the contralateral side.  Examination was performed during quiet respiration and with phonation.  I was present for the entire procedure.  The following findings were noted.    Findings:  Mucosa:  without erythema or discharge   Nasal septum:  normal   Turbinates:  normal   Adenoid:  normal   Eustachian tubes:  normal   Mucous stranding:  present   Lesions:  absent   Modified Mueller's Maneuver not indicated   Larynx Supraglottis, false and true vocal cord were normal.  Vocal cord mobility was normal.  Subglottis is patent.  Acid reflux changes       Condition:  Stable    Complications:  None    Pictures:                  Charitie was seen today for new  patient.    Diagnoses and all orders for this visit:    Laryngopharyngeal reflux (LPR)  -     PR LARYNGOSCOPY FLEXIBLE DIAGNOSTIC    Globus sensation  -     PR LARYNGOSCOPY FLEXIBLE DIAGNOSTIC    Hoarseness of voice  -     PR LARYNGOSCOPY FLEXIBLE DIAGNOSTIC    Other orders  -     omeprazole (PRILOSEC) 40 MG delayed release capsule; Take 1 capsule by mouth daily  -     aluminum hydroxide-magnesium carbonate (GAVISCON) 95-358 MG/15ML suspension; Take  15 mLs by mouth nightly    Flexible laryngoscope performed in the office showing acid reflux changes in the larynx and hypopharynx.  There is also slight hooding of the right arytenoid will continue to monitor at this time.  She was placed on Prilosec 40 mg 1 capsule once daily for 30 days with Gaviscon, 15 mL at bedtime.  Also encourage patient to elevate the head of her bed, not eat 4 hours prior to bedtime, and decrease any spicy, greasy, and acidic foods, coffee, or sodas.  She will follow-up in 1 month.  She will call for any new or worsening symptoms prior to her next appointment.      Ed BlalockMichael S. Ayako Tapanes, MSN, FNP-C  [facility] [place]    The information contained in this note has been dictated using drug and medical speech recognition software and may contain errors

## 2022-06-12 MED ORDER — OMEPRAZOLE 40 MG PO CPDR
40 MG | ORAL_CAPSULE | Freq: Every day | ORAL | 0 refills | Status: AC
Start: 2022-06-12 — End: 2022-09-10

## 2022-06-12 NOTE — Telephone Encounter (Signed)
Nov 08/09/22

## 2022-06-12 NOTE — Telephone Encounter (Signed)
Updated to 90 days.

## 2022-06-13 ENCOUNTER — Ambulatory Visit: Admit: 2022-06-13 | Payer: MEDICARE | Primary: Family

## 2022-06-13 DIAGNOSIS — R748 Abnormal levels of other serum enzymes: Secondary | ICD-10-CM

## 2022-06-14 ENCOUNTER — Encounter

## 2022-06-14 MED ORDER — TORSEMIDE 20 MG PO TABS
20 MG | ORAL_TABLET | Freq: Every day | ORAL | 3 refills | Status: DC
Start: 2022-06-14 — End: 2022-07-06

## 2022-06-27 ENCOUNTER — Encounter: Payer: MEDICARE | Attending: Internal Medicine | Primary: Family

## 2022-06-28 ENCOUNTER — Encounter: Admit: 2022-06-28 | Discharge: 2022-06-28 | Payer: MEDICARE | Attending: Internal Medicine | Primary: Family

## 2022-06-28 DIAGNOSIS — I5032 Chronic diastolic (congestive) heart failure: Secondary | ICD-10-CM

## 2022-06-28 NOTE — Progress Notes (Signed)
[facility]iology consult  Dr. Rolan Bucco      Reason for Consult: Pedal edema  Referring Physician: Vilinda Boehringer, APRN - CNP     CHIEF COMPLAINT:   Chief Complaint   Patient presents with    Congestive Heart Failure     Np- referred by Vilinda Boehringer. Pt has no cardiac complaints       HISTORY OF PRESENT ILLNESS:   Patient is 72 years old female with history of hypertension, hypothyroidism, was referred to cardiology for evaluation of pedal edema.  For the last few weeks patient has been complaining of pedal edema, retaining water and tightness of the hands, weight gain, denies any shortness of breath, no palpitations, no lightheadedness or dizziness, no orthopnea, no PND, no syncope, no presyncopal episodes.      Past Medical History:   Diagnosis Date    Bipolar 1 disorder (HCC)     Diverticular disease     Hypertension     Hypothyroidism     Spinal stenosis          Past Surgical History:   Procedure Laterality Date    APPENDECTOMY      BACK SURGERY      CHOLECYSTECTOMY      COLONOSCOPY      HYSTERECTOMY (CERVIX STATUS UNKNOWN)      KNEE SURGERY      torn meninscus    LAP BAND      LUMBAR LAMINECTOMY      OVARY REMOVAL           Current Outpatient Medications   Medication Sig Dispense Refill    torsemide (DEMADEX) 20 MG tablet Take 1 tablet by mouth daily 30 tablet 3    valsartan (DIOVAN) 320 MG tablet Take 1 tablet by mouth daily 30 tablet 5    levothyroxine (SYNTHROID) 125 MCG tablet Take 2 tablets by mouth Daily      Cholecalciferol (VITAMIN D3) 1000 units CAPS Take 1 capsule by mouth daily      Multiple Vitamin (MULTIVITAMIN ADULT PO) Take 1 capsule by mouth daily      B-Complex TABS Take 1 tablet by mouth daily      vitamin B-6 (PYRIDOXINE) 100 MG tablet Take 1 tablet by mouth daily      buPROPion (WELLBUTRIN XL) 150 MG extended release tablet Take 1 tablet by mouth every morning      lamoTRIgine (LAMICTAL) 200 MG tablet Take 1 tablet by mouth daily      zinc sulfate (ZINCATE) 220 (50 Zn) MG capsule  Take 1 capsule by mouth daily      ALPRAZolam (XANAX) 1 MG tablet Take 1 tablet by mouth daily as needed for Sleep. 2 a day as needed      omeprazole (PRILOSEC) 40 MG delayed release capsule TAKE 1 CAPSULE BY MOUTH DAILY (Patient not taking: Reported on 06/28/2022) 90 capsule 0    aluminum hydroxide-magnesium carbonate (GAVISCON) 95-358 MG/15ML suspension Take 15 mLs by mouth nightly (Patient not taking: Reported on 06/28/2022) 450 mL 1    ascorbic acid (VITAMIN C) 500 MG tablet Take 1 tablet by mouth daily (Patient not taking: Reported on 06/28/2022)       No current facility-administered medications for this visit.         Allergies as of 06/28/2022    (No Known Allergies)       Social History     Socioeconomic History    Marital status: Divorced     Spouse name: Not  on file    Number of children: Not on file    Years of education: Not on file    Highest education level: Not on file   Occupational History    Not on file   Tobacco Use    Smoking status: Former     Types: Cigarettes    Smokeless tobacco: Never    Tobacco comments:     Smoked on and off.  Haven't smoked for about 20 years   Vaping Use    Vaping Use: Every day    Substances: THC   Substance and Sexual Activity    Alcohol use: Yes     Comment: wine daily    Drug use: Yes     Types: Marijuana Sheran Fava)     Comment: THC/medical marijuana card    Sexual activity: Not Currently     Partners: Male   Other Topics Concern    Not on file   Social History Narrative    Not on file     Social Determinants of Health     Financial Resource Strain: Not on file   Food Insecurity: Not on file   Transportation Needs: Not on file   Physical Activity: Inactive (04/12/2022)    Exercise Vital Sign     Days of Exercise per Week: 0 days     Minutes of Exercise per Session: 0 min   Stress: Not on file   Social Connections: Not on file   Intimate Partner Violence: Not on file   Housing Stability: Not on file       Family History   Problem Relation Age of Onset    Amyotrophic lateral  sclerosis Mother     Thyroid Disease Father     Gout Father     Stroke Father     Cancer Sister         throat, esophagus, tongue, vaginal    Hemochromatosis Sister     Leukemia Brother     Hypertension Daughter     Thyroid Disease Daughter     Hemochromatosis Son     Liver Disease Son     Gout Son        REVIEW OF SYSTEMS:     CONSTITUTIONAL:  negative for  fevers, chills, sweats and fatigue  EYES:  negative for  double vision, blurred vision and blind spots  HEENT:  negative for  tinnitus, earaches, nasal congestion and epistaxis  RESPIRATORY:  negative for  dry cough, cough with sputum,wheezing and hemoptysis  CARDIOVASCULAR: as per HPI  GASTROINTESTINAL:  negative for nausea, vomiting, diarrhea, constipation, pruritus and jaundice  GENITOURINARY:  negative for frequency, dysuria, nocturia, urinary incontinence and hesitancy  HEMATOLOGIC/LYMPHATIC:  negative for easy bruising, bleeding, lymphadenopathy and petechiae  ALLERGIC/IMMUNOLOGIC:  negative for urticaria, hay fever and angioedema  ENDOCRINE:  negative for heat intolerance, cold intolerance, tremor, hair loss and diabetic symptoms including neither polyuria nor polydipsia nor blurred vision  MUSCULOSKELETAL:  negative for  myalgias, arthralgias, joint swelling, stiff joints and decreased range of motion  NEUROLOGICAL:  negative for memory problems, speech problems, visual disturbance, dysphagia, weakness and numbness      PHYSICAL EXAM:   CONSTITUTIONAL:  awake, alert, cooperative, no apparent distress, and appears stated age  EYES:  lids and lashes normal, anicteric sclerae  HEAD:  normocepalic, without obvious abnormality, atraumatic, pink, moist mucous membranes.  NECK:  Supple, symmetrical, trachea midline, no adenopathy, thyroid symmetric, not enlarged and no tenderness, skin normal  HEMATOLOGIC/LYMPHATICS:  no  cervical lymphadenopathy and no supraclavicular lymphadenopathy  LUNGS:  No increased work of breathing, good air exchange, clear to  auscultation bilaterally, no crackles or wheezing  CARDIOVASCULAR:  Normal apical impulse, regular rate and rhythm, normal S1 and S2, no S3 or S4, and no murmur noted and no JVD, no carotid bruit, no pedal edema, good carotid upstroke bilaterally.  ABDOMEN:  Soft, nontender, no masses, no hepatomegaly or splenomegaly, BS+  CHEST: nontender to palpation, expands symmetrically  MUSCULOSKELETAL:  No clubbing no cyanosis.there is no redness, warmth, or swelling of the joints  full range of motion noted  NEUROLOGIC:  Alert, awake,oriented x3, no focal neurologic deficit was appreciated  SKIN:  no bruising or bleeding, normal skin color, texture, turgor and no redness, warmth, or swelling    BP 128/78   Pulse 73   Resp 16   Ht 1.575 m (5\' 2" )   Wt 90.9 kg (200 lb 6.4 oz)   SpO2 97%   BMI 36.65 kg/m     DATA:   I personally reviewed the visit EKG with the following interpretation: Sinus rhythm, possible old anteroseptal wall MI age undetermined, no previous to compare to.    ECHO: 01/31/2017,  - Left ventricle: The cavity size was normal. There was moderate     concentric hypertrophy. Systolic function was normal. The     estimated ejection fraction was in the range of 60% to 65%. Wall     motion was normal; there were no regional wall motion     abnormalities. Doppler parameters are consistent with abnormal     left ventricular relaxation (grade 1 diastolic dysfunction). The     E/e&' ratio is between 8-15, suggesting indeterminate LV filling     pressure.   - Mitral valve: Mildly thickened leaflets . There was trivial     regurgitation.   - Left atrium: The atrium was normal in size.   - Inferior vena cava: The vessel was normal in size. The     respirophasic diameter changes were in the normal range (= 50%),     consistent with normal central venous pressure.     Stress Test: 02/01/2017,  Overall Study Impression   Myocardial perfusion is abnormal. This is a low risk study. Overall left ventricular systolic  function was normal. Nuclear stress EF: 61%.     Angiography: Not performed to date  Cardiology Labs: BMP:    Lab Results   Component Value Date/Time    NA 136 06/08/2022 12:05 PM    K 4.7 06/08/2022 12:05 PM    CL 98 06/08/2022 12:05 PM    CO2 24 06/08/2022 12:05 PM    BUN 16 06/08/2022 12:05 PM    CREATININE 1.0 06/08/2022 12:05 PM     CMP:    Lab Results   Component Value Date/Time    NA 136 06/08/2022 12:05 PM    K 4.7 06/08/2022 12:05 PM    CL 98 06/08/2022 12:05 PM    CO2 24 06/08/2022 12:05 PM    BUN 16 06/08/2022 12:05 PM    CREATININE 1.0 06/08/2022 12:05 PM    PROT 6.7 06/08/2022 12:05 PM     CBC:    Lab Results   Component Value Date/Time    WBC 3.6 05/17/2022 11:19 AM    RBC 3.63 05/17/2022 11:19 AM    HGB 12.5 05/17/2022 11:19 AM    HCT 37.9 05/17/2022 11:19 AM    MCV 104.4 05/17/2022 11:19 AM    RDW  12.3 05/17/2022 11:19 AM    PLT 156 05/17/2022 11:19 AM     PT/INR:  No results found for: "PTINR"  PT/INR Warfarin:  No components found for: "PTPATWAR", "PTINRWAR"  PTT:  No results found for: "APTT"  PTT Heparin:  No components found for: "APTTHEP"  Magnesium:  No results found for: "MG"  TSH:    Lab Results   Component Value Date/Time    TSH 1.42 04/12/2022 01:30 PM     TROPONIN:  No components found for: "TROP"  BNP:  No results found for: "BNP"  FASTING LIPID PANEL:    Lab Results   Component Value Date/Time    CHOL 182 04/12/2022 01:30 PM    HDL 111 04/12/2022 01:30 PM    TRIG 112 04/12/2022 01:30 PM     No orders to display     I have personally reviewed the laboratory, cardiac diagnostic and radiographic testing as outlined above:  Old records from [facility] reviewed and summarized as above    IMPRESSION:  1.  Pedal edema and hypervolemia: Secondary to congestive heart failure?,  Will check BNP, will check echocardiogram, will continue diuretics for now  2.  Hypertension: Controlled  3.  Hypothyroidism: On Synthroid supplement  4.  Elevated LFTs: Secondary to congestion?,  Will  follow    RECOMMENDATIONS:   1.  Increase torsemide to twice daily  2.  Continue the rest of medications  3.  CHF: Daily weight, take an extra torsemide for weight gain of more than 2-3 pounds in 24 hours, compliance with diuretics, low-salt diet were all advised.  4.  BNP  5.  Echocardiogram  6.  Follow-up with Vilinda Boehringer, APRN-CNP as scheduled  7.  Follow-up with Dr. Arnette Norris after her tests    I have reviewed my findings and recommendations with patient    Thank you for the consult  Electronically signed by Rolan Bucco, MD on 06/28/2022 at 3:53 PM      NOTE: This report was transcribed using voice recognition software. Every effort was made to ensure accuracy; however, inadvertent computerized transcription errors may be present

## 2022-07-04 ENCOUNTER — Encounter

## 2022-07-04 ENCOUNTER — Encounter: Admit: 2022-07-04 | Discharge: 2022-07-04 | Payer: MEDICARE | Primary: Family

## 2022-07-05 LAB — BRAIN NATRIURETIC PEPTIDE: Pro-BNP: 91 pg/mL (ref 0–125)

## 2022-07-06 ENCOUNTER — Encounter

## 2022-07-10 ENCOUNTER — Encounter

## 2022-07-10 MED ORDER — TORSEMIDE 20 MG PO TABS
20 MG | ORAL_TABLET | Freq: Two times a day (BID) | ORAL | 0 refills | Status: AC
Start: 2022-07-10 — End: 2022-07-11

## 2022-07-11 MED ORDER — TORSEMIDE 20 MG PO TABS
20 | ORAL_TABLET | Freq: Two times a day (BID) | ORAL | 3 refills | Status: DC
Start: 2022-07-11 — End: 2022-07-18

## 2022-07-13 NOTE — Telephone Encounter (Signed)
Results

## 2022-07-18 ENCOUNTER — Encounter

## 2022-07-18 MED ORDER — TORSEMIDE 20 MG PO TABS
20 | ORAL_TABLET | Freq: Two times a day (BID) | ORAL | 3 refills | Status: AC
Start: 2022-07-18 — End: ?

## 2022-07-19 ENCOUNTER — Ambulatory Visit: Admit: 2022-07-19 | Discharge: 2022-07-19 | Payer: MEDICARE | Attending: Hematology & Oncology | Primary: Family

## 2022-07-19 ENCOUNTER — Encounter: Payer: MEDICARE | Primary: Family

## 2022-07-19 DIAGNOSIS — D7589 Other specified diseases of blood and blood-forming organs: Secondary | ICD-10-CM

## 2022-07-19 LAB — CBC WITH AUTO DIFFERENTIAL
Basophils %: 2 % (ref 0.0–2.0)
Basophils Absolute: 0.05 10*3/uL (ref 0.00–0.20)
Eosinophils %: 3 % (ref 0–6)
Eosinophils Absolute: 0.11 10*3/uL (ref 0.05–0.50)
Hematocrit: 40.1 % (ref 34.0–48.0)
Hemoglobin: 13.9 g/dL (ref 11.5–15.5)
Immature Granulocytes %: 0 % (ref 0.0–5.0)
Immature Granulocytes Absolute: <0.03 10*3/uL (ref 0.00–0.58)
Lymphocytes %: 44 % — ABNORMAL HIGH (ref 20.0–42.0)
Lymphocytes Absolute: 1.44 10*3/uL — ABNORMAL LOW (ref 1.50–4.00)
MCH: 35.1 pg — ABNORMAL HIGH (ref 26.0–35.0)
MCHC: 34.7 g/dL — ABNORMAL HIGH (ref 32.0–34.5)
MCV: 101.3 fL — ABNORMAL HIGH (ref 80.0–99.9)
MPV: 10.5 fL (ref 7.0–12.0)
Monocytes %: 9 % (ref 2.0–12.0)
Monocytes Absolute: 0.31 10*3/uL (ref 0.10–0.95)
Neutrophils %: 42 % — ABNORMAL LOW (ref 43.0–80.0)
Neutrophils Absolute: 1.37 10*3/uL — ABNORMAL LOW (ref 1.80–7.30)
Platelet, Fluorescence: 141 10*3/uL (ref 130–450)
RBC: 3.96 m/uL (ref 3.50–5.50)
RDW: 11.4 % — ABNORMAL LOW (ref 11.5–15.0)
WBC: 3.3 10*3/uL — ABNORMAL LOW (ref 4.5–11.5)

## 2022-07-19 LAB — HIV SCREEN: HIV Ag/Ab: NONREACTIVE

## 2022-07-19 LAB — HEPATITIS PANEL, ACUTE
Hep A IgM: NONREACTIVE
Hep B Core Ab, IgM: NONREACTIVE
Hepatitis B Surface Ag: NONREACTIVE
Hepatitis C Ab: NONREACTIVE

## 2022-07-19 NOTE — Progress Notes (Signed)
Margaret Zimmerman  15-Jul-1950 72 y.o.      Referring Physician:   PCP: Vilinda Boehringer, APRN - CNP    Vitals:    07/19/22 0805   BP: (!) 143/83   Pulse: 64   Temp: 98 F (36.7 C)   SpO2: 100%        Wt Readings from Last 3 Encounters:   07/19/22 89.2 kg (196 lb 11.2 oz)   06/28/22 90.9 kg (200 lb 6.4 oz)   06/11/22 90.5 kg (199 lb 9.6 oz)        Body mass index is 35.98 kg/m.          Chief Complaint:   Chief Complaint   Patient presents with    New Patient     Neutropenia OV          Cancer Staging   No matching staging information was found for the patient.    Prior Radiation Therapy? NO    Concurrent Chemo/radiation? NO    Prior Chemotherapy? NO    Prior Hormonal Therapy? NO    Head and Neck Cancer? No, patient does NOT have HN cancer.      LMP: HYSTERECTOMY    Age at first Menses: 89    Gravida: 3    Para: 3          Current Outpatient Medications:     torsemide (DEMADEX) 20 MG tablet, Take 1 tablet by mouth in the morning and at bedtime, Disp: 180 tablet, Rfl: 3    valsartan (DIOVAN) 320 MG tablet, Take 1 tablet by mouth daily, Disp: 30 tablet, Rfl: 5    levothyroxine (SYNTHROID) 125 MCG tablet, Take 2 tablets by mouth Daily, Disp: , Rfl:     Cholecalciferol (VITAMIN D3) 1000 units CAPS, Take 1 capsule by mouth daily, Disp: , Rfl:     Multiple Vitamin (MULTIVITAMIN ADULT PO), Take 1 capsule by mouth daily, Disp: , Rfl:     B-Complex TABS, Take 1 tablet by mouth daily, Disp: , Rfl:     vitamin B-6 (PYRIDOXINE) 100 MG tablet, Take 1 tablet by mouth daily, Disp: , Rfl:     buPROPion (WELLBUTRIN XL) 150 MG extended release tablet, Take 1 tablet by mouth every morning, Disp: , Rfl:     lamoTRIgine (LAMICTAL) 200 MG tablet, Take 1 tablet by mouth daily, Disp: , Rfl:     zinc sulfate (ZINCATE) 220 (50 Zn) MG capsule, Take 1 capsule by mouth daily, Disp: , Rfl:     ALPRAZolam (XANAX) 1 MG tablet, Take 1 tablet by mouth daily as needed for Sleep. 2 a day as needed, Disp: , Rfl:     omeprazole (PRILOSEC) 40 MG delayed  release capsule, TAKE 1 CAPSULE BY MOUTH DAILY (Patient not taking: Reported on 06/28/2022), Disp: 90 capsule, Rfl: 0    ascorbic acid (VITAMIN C) 500 MG tablet, Take 1 tablet by mouth daily (Patient not taking: Reported on 06/28/2022), Disp: , Rfl:        Past Medical History:   Diagnosis Date    Bipolar 1 disorder (HCC)     Diverticular disease     Hypertension     Hypothyroidism     Spinal stenosis        Past Surgical History:   Procedure Laterality Date    APPENDECTOMY      BACK SURGERY      CHOLECYSTECTOMY      COLONOSCOPY      HYSTERECTOMY (CERVIX STATUS UNKNOWN)  KNEE SURGERY      torn meninscus    LAP BAND      LUMBAR LAMINECTOMY      OVARY REMOVAL         Family History   Problem Relation Age of Onset    Amyotrophic lateral sclerosis Mother     Thyroid Disease Father     Gout Father     Stroke Father     Cancer Sister         throat, esophagus, tongue, vaginal    Hemochromatosis Sister     Leukemia Brother     Hypertension Daughter     Thyroid Disease Daughter     Hemochromatosis Son     Liver Disease Son     Gout Son        Social History     Socioeconomic History    Marital status: Divorced     Spouse name: Not on file    Number of children: Not on file    Years of education: Not on file    Highest education level: Not on file   Occupational History    Not on file   Tobacco Use    Smoking status: Former     Types: Cigarettes    Smokeless tobacco: Never    Tobacco comments:     Smoked on and off.  Haven't smoked for about 20 years   Vaping Use    Vaping Use: Every day    Substances: THC   Substance and Sexual Activity    Alcohol use: Yes     Comment: wine daily    Drug use: Yes     Types: Marijuana Sheran Fava)     Comment: THC/medical marijuana card    Sexual activity: Not Currently     Partners: Male   Other Topics Concern    Not on file   Social History Narrative    Not on file     Social Determinants of Health     Financial Resource Strain: Not on file   Food Insecurity: Not on file   Transportation  Needs: Not on file   Physical Activity: Inactive (04/12/2022)    Exercise Vital Sign     Days of Exercise per Week: 0 days     Minutes of Exercise per Session: 0 min   Stress: Not on file   Social Connections: Not on file   Intimate Partner Violence: Not on file   Housing Stability: Not on file           Occupation: RETIRED  Retired:  YES: Patient is retired from [employer].          REVIEW OF SYSTEMS:     Pacemaker/Defibulator/ICD:  No    Mediport: No           FALLS RISK SCREENING ASSESSMENT    Instructions:  Assess the patient and circle the appropriate indicators that are present for fall risk identification.   Total the numbers circled and assign a fall risk score from Table 2.  Reassess patient at a minimum every 12 weeks or with status change.    Assessment   Date  07/19/2022     1.  Mental Ability: confusion/cognitively impaired No - 0       2.  Elimination Issues: incontinence, frequency No - 0       3.  Ambulatory: use of assistive devices (walker, cane, off-loading devices), attached to equipment (IV pole, oxygen) No - 0  4.  Sensory Limitations: dizziness, vertigo, impaired vision No - 0       5.  Age Less than 65 years - 0       6.  Medication: diuretics, strong analgesics, hypnotics, sedatives, antihypertensive agents   Yes - 3   7.  Falls:  recent history of falls within the last 3 months (not to include slipping or tripping)   No - 0   TOTAL 3    If score of 4 or greater was education given? No       TABLE 2   Risk Score Risk Level Plan of Care   0-3 Little or  No Risk 1.  Provide assistance as indicated for ambulation activities  2.  Reorient confused/cognitively impaired patient  3.  Call-light/bell within patient's reach  4.  Chair/bed in low position, stretcher/bed with siderails up except when performing patient care activities  5.  Educate patient/family/caregiver on falls prevention  6.  Reassess in 12 weeks or with any noted change in patient condition which places them at a risk for a fall   4-6  Moderate Risk 1.  Provide assistance as indicated for ambulation activities  2.  Reorient confused/cognitively impaired patient  3.  Call-light/bell within patient's reach  4.  Chair/bed in low position, stretcher/bed with siderails up except when performing patient care activities  5.  Educate patient/family/caregiver on falls prevention  6.  Falls risk precaution (Yellow sticker Level II) placed on patient chart   7 or   Higher High Risk 1.  Place patient in easily observable treatment room  2.  Patient attended at all times by family member or staff  3.  Provide assistance as indicated for ambulation activities  4.  Reorient confused/cognitively impaired patient  5.  Call-light/bell within patient's reach  6.  Chair/bed in low position, stretcher/bed with siderails up except when performing patient care activities  7.  Educate patient/family/caregiver on falls prevention  8.  Falls risk precaution (Yellow sticker Level III) placed on patient chart           MALNUTRITION RISK SCREENING ASSESSMENT    Instructions:  Assess the patient and enter the appropriate indicators that are present for nutrition risk identification. Total the numbers entered and assign a risk score. Follow the appropriate action for total score listed below.     Assessment   Date  07/19/2022     Have you lost weight without trying?      0- No     Have you been eating poorly because of a decreased appetite?         0- No   3. Do you have a diagnosis of head and neck cancer OR will you be receiving neoadjuvant treatment for breast cancer?      0- No                                                                                    TOTAL 0        Score of 0-1: No action  Score 2 or greater:  For Non-Diabetic Patient: Recommend adding Ensure Enlive 2 x daily and provide patient with Ensure  wellness bag with coupons  For Diabetic Patient: Recommend adding Glucerna Shake 2 x daily and provide patient with Glucerna Wellness bag with coupons  Route to  the dietitian via Epic                Annalee Genta, RN

## 2022-07-19 NOTE — Progress Notes (Signed)
[facility] PHYSICIANS [facility] CARE [place]  [facility] MEDICAL ONCOLOGY  [address]  [place] [address]  Dept: [phone]  Loc: [phone]  Attending Consult Note      Reason for Visit:   Leukopenia and macrocytosis.    Referring Physician:  Vilinda Boehringer, APRN - CNP    PCP:  Vilinda Boehringer, APRN - CNP    History of Present Illness:      Mrs. Margaret Zimmerman is a pleasant 72 year old lady, with a past medical history significant for bipolar disorder, diverticular disease, hypertension, hypothyroidism, and spinal stenosis who was referred to the hematology office for evaluation of leukopenia/neutropenia and macrocytosis.  The patient CBCD from 05/17/2022 was remarkable for a Amison count of 3.6, with an ANC of 1460, ALC 1730, no anemia or thrombocytopenia, MCV 104.4.  In February 2024 Morado count was normal, 5.3.  She had had macrocytosis since at least 2021.  The patient denies any unexplained weight loss, she does have night sweats, no recurrent infections.    Review of Systems;  CONSTITUTIONAL: No fever, chills.  Fair appetite.  ENMT: Eyes: No diplopia; Nose: No epistaxis. Mouth: No sore throat.  RESPIRATORY: No hemoptysis, shortness of breath, cough.   CARDIOVASCULAR: No chest pain, palpitations.  GASTROINTESTINAL: No nausea/vomiting, abdominal pain, diarrhea/constipation.  GENITOURINARY: No dysuria, urinary frequency, hematuria.  NEURO: No syncope, presyncope, headache.  Remainder:  ROS NEGATIVE    Past Medical History:      Diagnosis Date    Bipolar 1 disorder (HCC)     Diverticular disease     Hypertension     Hypothyroidism     Spinal stenosis      Patient Active Problem List   Diagnosis    Bipolar I disorder (HCC)    Chronic diastolic CHF (congestive heart failure) (HCC)    Edema    Essential hypertension        Past Surgical History:      Procedure Laterality Date    APPENDECTOMY      BACK SURGERY      CHOLECYSTECTOMY      COLONOSCOPY      HYSTERECTOMY (CERVIX STATUS UNKNOWN)      KNEE SURGERY       torn meninscus    LAP BAND      LUMBAR LAMINECTOMY      OVARY REMOVAL         Family History:  Family History   Problem Relation Age of Onset    Amyotrophic lateral sclerosis Mother     High Blood Pressure Father     Thyroid Disease Father     Gout Father     Stroke Father     Cancer Sister         throat, esophagus, tongue, vaginal    Hemochromatosis Sister     Leukemia Brother     Hypertension Daughter     Thyroid Disease Daughter     Hemochromatosis Son     Liver Disease Son     Gout Son        Medications:  Reviewed and reconciled.    Social History:  Social History     Socioeconomic History    Marital status: Divorced     Spouse name: Not on file    Number of children: Not on file    Years of education: Not on file    Highest education level: Not on file   Occupational History    Not on file   Tobacco Use  Smoking status: Former     Types: Cigarettes    Smokeless tobacco: Never    Tobacco comments:     Smoked on and off.  Haven't smoked for about 20 years   Vaping Use    Vaping Use: Every day    Substances: THC   Substance and Sexual Activity    Alcohol use: Yes     Comment: wine daily    Drug use: Yes     Types: Marijuana Sheran Fava)     Comment: THC/medical marijuana card    Sexual activity: Not Currently     Partners: Male   Other Topics Concern    Not on file   Social History Narrative    Not on file     Social Determinants of Health     Financial Resource Strain: Not on file   Food Insecurity: Not on file   Transportation Needs: Not on file   Physical Activity: Inactive (04/12/2022)    Exercise Vital Sign     Days of Exercise per Week: 0 days     Minutes of Exercise per Session: 0 min   Stress: Not on file   Social Connections: Not on file   Intimate Partner Violence: Not on file   Housing Stability: Not on file       Allergies:  No Known Allergies    Physical Exam:  BP (!) 143/83   Pulse 64   Temp 98 F (36.7 C)   Ht 1.575 m (5\' 2" )   Wt 89.2 kg (196 lb 11.2 oz)   SpO2 100%   BMI 35.98 kg/m   GENERAL:  Alert, oriented x 3, not in acute distress.  HEENT: PERRLA; EOMI. Oropharynx clear.   NECK: Supple. No palpable cervical or supraclavicular lymphadenopathy.   LUNGS: Good air entry bilaterally. No wheezing, crackles or rhonchi.   CARDIOVASCULAR: Regular rate. No murmurs, rubs or gallops.   ABDOMEN: Soft. Non-tender, non-distended. Positive bowel sounds.  EXTREMITIES: Without clubbing, cyanosis, or edema.   NEUROLOGIC: No focal deficits.   ECOG PS 0        Impression/Plan:      Mrs. Margaret Zimmerman is a pleasant 72 year old lady, with a past medical history significant for bipolar disorder, diverticular disease, hypertension, hypothyroidism, and spinal stenosis who was referred to the hematology office for evaluation of leukopenia/neutropenia and macrocytosis.  The patient CBCD from 05/17/2022 was remarkable for a Rohleder count of 3.6, with an ANC of 1460, ALC 1730, no anemia or thrombocytopenia, MCV 104.4.  In February 2024 Grounds count was normal, 5.3.  She had had macrocytosis since at least 2021.  From 05/17/2022 ALT 30, AST 40.    The patient has leukopenia and neutropenia, of new onset, could be reactive, secondary to inflammation, possibly an infection, the patient has transaminitis, she did have an ultrasound of the abdomen done on 06/13/2022, which I had reviewed, revealing normal liver echogenicity.  Vitamin B12 716, folate greater than 20.  Peripheral smear had reviewed by neutropenia and macrocytosis recommended a workup to evaluate for an autoimmune disease, chronic viral infections, on the differential is a primary bone marrow disorder.  Will have flow cytometry done, ANA, Viral hepatitis panel, HIV screen.  The patient has macrocytosis without anemia, could be secondary to alcohol, discussed with the patient importance of abstinence from alcohol.  Will order SPEP.  Discussed possible spleen imaging pending the workup results.     RTC in 2 weeks.    Thank you for allowing [place] to participate  in the care of Mrs.  Margaret Zimmerman.    Celesta Aver, MD   HEMATOLOGY/MEDICAL ONCOLOGY  [facility] PHYSICIANS [place] CARE [facility]  [facility] MEDICAL ONCOLOGY  [address]  [place] [address]  Dept: [phone]  Loc: [phone]

## 2022-07-19 NOTE — Progress Notes (Signed)
Labs drawn peripherally and dry dressing applied. Specimen sent to lab.

## 2022-07-20 LAB — ELECTROPHORESIS PROTEIN, SERUM
Albumin (calculated): 4 g/dL (ref 3.5–4.7)
Alpha-1-Globulin: 0.3 g/dL (ref 0.2–0.4)
Alpha-2-Globulin: 0.7 g/dL (ref 0.5–1.0)
Beta Globulin: 0.8 g/dL (ref 0.8–1.3)
Gamma Globulin: 0.7 g/dL (ref 0.7–1.6)
Protein Electrophoresis, Serum: NORMAL
Total Protein: 6.5 g/dL (ref 6.4–8.3)

## 2022-07-20 LAB — ANA: ANA: NEGATIVE

## 2022-07-21 LAB — MISCELLANEOUS SENDOUT

## 2022-08-06 ENCOUNTER — Ambulatory Visit: Admit: 2022-08-06 | Discharge: 2022-08-06 | Payer: Medicare Other | Attending: Hematology & Oncology | Primary: Family

## 2022-08-06 ENCOUNTER — Encounter: Payer: MEDICARE | Attending: Family | Primary: Family

## 2022-08-06 DIAGNOSIS — D7589 Other specified diseases of blood and blood-forming organs: Secondary | ICD-10-CM

## 2022-08-06 NOTE — Progress Notes (Signed)
[facility] PHYSICIANS [facility] CARE [place]  [facility] MEDICAL ONCOLOGY  [address]  [place] [address]  Dept: [phone]  Loc: [phone]  Attending progress note      Reason for Visit:   Leukopenia and macrocytosis.    Referring Physician:  Vilinda Boehringer, APRN - CNP    PCP:  Vilinda Boehringer, APRN - CNP    History of Present Illness:      Mrs. Ixta is a pleasant 72 year old lady, with a past medical history significant for bipolar disorder, diverticular disease, hypertension, hypothyroidism, and spinal stenosis who was referred to the hematology office for evaluation of leukopenia/neutropenia and macrocytosis.  The patient CBCD from 05/17/2022 was remarkable for a Tuley count of 3.6, with an ANC of 1460, ALC 1730, no anemia or thrombocytopenia, MCV 104.4.  In February 2024 Pidcock count was normal, 5.3.  She had had macrocytosis since at least 2021.  The patient denies any unexplained weight loss, her weight does fluctuate, she does have night sweats, no recurrent infections.    Review of Systems;  CONSTITUTIONAL: No fever, chills.  Fair appetite.  ENMT: Eyes: No diplopia; Nose: No epistaxis. Mouth: No sore throat.  RESPIRATORY: No hemoptysis, shortness of breath, cough.   CARDIOVASCULAR: No chest pain, palpitations.  GASTROINTESTINAL: No nausea/vomiting, abdominal pain, diarrhea/constipation.  GENITOURINARY: No dysuria, urinary frequency, hematuria.  NEURO: No syncope, presyncope, headache.  Remainder:  ROS NEGATIVE    Past Medical History:      Diagnosis Date    Bipolar 1 disorder (HCC)     Diverticular disease     Hypertension     Hypothyroidism     Spinal stenosis      Patient Active Problem List   Diagnosis    Bipolar I disorder (HCC)    Chronic diastolic CHF (congestive heart failure) (HCC)    Edema    Essential hypertension        Past Surgical History:      Procedure Laterality Date    APPENDECTOMY      BACK SURGERY      CHOLECYSTECTOMY      COLONOSCOPY      HYSTERECTOMY (CERVIX STATUS  UNKNOWN)      KNEE SURGERY      torn meninscus    LAP BAND      LUMBAR LAMINECTOMY      OVARY REMOVAL         Family History:  Family History   Problem Relation Age of Onset    Amyotrophic lateral sclerosis Mother     High Blood Pressure Father     Thyroid Disease Father     Gout Father     Stroke Father     Cancer Sister         throat, esophagus, tongue, vaginal    Hemochromatosis Sister     Leukemia Brother     Hypertension Daughter     Thyroid Disease Daughter     Hemochromatosis Son     Liver Disease Son     Gout Son        Medications:  Reviewed and reconciled.    Social History:  Social History     Socioeconomic History    Marital status: Divorced     Spouse name: Not on file    Number of children: Not on file    Years of education: Not on file    Highest education level: Not on file   Occupational History    Not on file  Tobacco Use    Smoking status: Former     Types: Cigarettes    Smokeless tobacco: Never    Tobacco comments:     Smoked on and off.  Haven't smoked for about 20 years   Vaping Use    Vaping Use: Every day    Substances: THC   Substance and Sexual Activity    Alcohol use: Yes     Comment: wine daily    Drug use: Yes     Types: Marijuana Sheran Fava)     Comment: THC/medical marijuana card    Sexual activity: Not Currently     Partners: Male   Other Topics Concern    Not on file   Social History Narrative    Not on file     Social Determinants of Health     Financial Resource Strain: Not on file   Food Insecurity: Not on file   Transportation Needs: Not on file   Physical Activity: Inactive (04/12/2022)    Exercise Vital Sign     Days of Exercise per Week: 0 days     Minutes of Exercise per Session: 0 min   Stress: Not on file   Social Connections: Not on file   Intimate Partner Violence: Not on file   Housing Stability: Not on file       Allergies:  No Known Allergies    Physical Exam:  BP (!) 150/95   Pulse 71   Temp 98.1 F (36.7 C)   Ht 1.575 m (5\' 2" )   Wt 86.4 kg (190 lb 8 oz)   SpO2 100%    BMI 34.84 kg/m   GENERAL: Alert, oriented x 3, not in acute distress.  HEENT: PERRLA; EOMI. Oropharynx clear.   NECK: Supple. No palpable cervical or supraclavicular lymphadenopathy.   LUNGS: Good air entry bilaterally. No wheezing, crackles or rhonchi.   CARDIOVASCULAR: Regular rate. No murmurs, rubs or gallops.   ABDOMEN: Soft. Non-tender, non-distended. Positive bowel sounds.  EXTREMITIES: Without clubbing, cyanosis, or edema.   NEUROLOGIC: No focal deficits.   ECOG PS 0        Impression/Plan:      Mrs. Shahbaz is a pleasant 72 year old lady, with a past medical history significant for bipolar disorder, diverticular disease, hypertension, hypothyroidism, and spinal stenosis who was referred to the hematology office for evaluation of leukopenia/neutropenia and macrocytosis.  The patient CBCD from 05/17/2022 was remarkable for a Tardiff count of 3.6, with an ANC of 1460, ALC 1730, no anemia or thrombocytopenia, MCV 104.4.  In February 2024 Kittrell count was normal, 5.3.  She had had macrocytosis since at least 2021.  From 05/17/2022 ALT 30, AST 40.    The patient has leukopenia and neutropenia, of new onset, could be reactive, secondary to inflammation, possibly an infection, the patient has transaminitis, she did have an ultrasound of the abdomen done on 06/13/2022, which I had reviewed, revealing normal liver echogenicity.  Vitamin B12 716, folate greater than 20.  Peripheral smear had reviewed by neutropenia and macrocytosis recommended a workup to evaluate for an autoimmune disease, chronic viral infections, on the differential is a primary bone marrow disorder.  The patient has macrocytosis without anemia, could be secondary to alcohol, discussed with the patient the importance of abstinence from alcohol.      The workup results were reviewed with the patient, ANA is negative, HIV screen is negative, viral hepatitis panel is also negative, SPEP is negative for monoclonal proteins, CBCD had revealed a Hallberg  count  of 3.3, ANC is stable 1370, ALC 1440, normal hemoglobin, MCV had improved to 101.3, hemoglobin 13.9, flow cytometry had revealed relative neutropenia with activated immunophenotype, which could be secondary to exogenous factors/insults, infection, inflammation, medication, immune mediated destruction, or in the setting of a systemic autoimmune disease, potentially due to underlying bone marrow based myeloid disorder.  The results were reviewed with the patient, we discussed the option of having the bone marrow biopsy and aspirate done to exclude a primary bone marrow disorder, or to have a follow-up in 2 months with blood counts and repeat flow stomach tree as she has been having thyroid issues, we decided to have the blood work done in 2 months, bone marrow biopsy and aspirate if persistent changes.     RTC in 2 months, blood work 1 week prior.    Thank you for allowing [place] to participate in the care of Mrs. President.    Celesta Aver, MD   HEMATOLOGY/MEDICAL ONCOLOGY  [facility] PHYSICIANS [place] CARE [facility]  [facility] MEDICAL ONCOLOGY  [address]  [place] [address]  Dept: [phone]  Loc: [phone]

## 2022-08-09 ENCOUNTER — Encounter: Admit: 2022-08-09 | Discharge: 2022-08-09 | Payer: MEDICARE | Attending: Family | Primary: Family

## 2022-08-09 ENCOUNTER — Encounter

## 2022-08-09 DIAGNOSIS — I10 Essential (primary) hypertension: Secondary | ICD-10-CM

## 2022-08-09 MED ORDER — CARVEDILOL 3.125 MG PO TABS
3.125 | ORAL_TABLET | Freq: Two times a day (BID) | ORAL | 3 refills | Status: DC
Start: 2022-08-09 — End: 2022-08-21

## 2022-08-09 NOTE — Progress Notes (Unsigned)
No chief complaint on file.      HPI    Anxiety    ENT thinks she has acid reflux, and prescribed Nexium      Recommend she see bariatric surgery      Cardiology increased demadex     Echo on 01/31/17  Left ventricle:  The cavity size was normal. There was moderate   concentric hypertrophy. Systolic function was normal. The estimated   ejection fraction was in the range of 60% to 65%. Wall motion was   normal; there were no regional wall motion abnormalities. Doppler   parameters are consistent with abnormal left ventricular relaxation   (grade 1 diastolic dysfunction). The E/e&' ratio is between 8-15,   suggesting indeterminate LV filling pressure.     Patient Active Problem List   Diagnosis    Bipolar I disorder (HCC)    Chronic diastolic CHF (congestive heart failure) (HCC)    Edema    Essential hypertension       Past Medical History:   Diagnosis Date    Bipolar 1 disorder (HCC)     Diverticular disease     Hypertension     Hypothyroidism     Spinal stenosis        Past Surgical History:   Procedure Laterality Date    APPENDECTOMY      BACK SURGERY      CHOLECYSTECTOMY      COLONOSCOPY      HYSTERECTOMY (CERVIX STATUS UNKNOWN)      KNEE SURGERY      torn meninscus    LAP BAND      LUMBAR LAMINECTOMY      OVARY REMOVAL         Current Outpatient Medications   Medication Sig Dispense Refill    torsemide (DEMADEX) 20 MG tablet Take 1 tablet by mouth in the morning and at bedtime 180 tablet 3    valsartan (DIOVAN) 320 MG tablet Take 1 tablet by mouth daily 30 tablet 5    levothyroxine (SYNTHROID) 125 MCG tablet Take 2 tablets by mouth Daily      Cholecalciferol (VITAMIN D3) 1000 units CAPS Take 1 capsule by mouth daily      Multiple Vitamin (MULTIVITAMIN ADULT PO) Take 1 capsule by mouth daily      B-Complex TABS Take 1 tablet by mouth daily      vitamin B-6 (PYRIDOXINE) 100 MG tablet Take 1 tablet by mouth daily      buPROPion (WELLBUTRIN XL) 150 MG extended release tablet Take 1 tablet by mouth every morning       lamoTRIgine (LAMICTAL) 200 MG tablet Take 1 tablet by mouth daily      zinc sulfate (ZINCATE) 220 (50 Zn) MG capsule Take 1 capsule by mouth daily      ALPRAZolam (XANAX) 1 MG tablet Take 1 tablet by mouth daily as needed for Sleep. 2 a day as needed       No current facility-administered medications for this visit.       No Known Allergies    Social History     Socioeconomic History    Marital status: Divorced   Tobacco Use    Smoking status: Former     Types: Cigarettes    Smokeless tobacco: Never    Tobacco comments:     Smoked on and off.  Haven't smoked for about 20 years   Vaping Use    Vaping Use: Every day    Substances: THC  Substance and Sexual Activity    Alcohol use: Yes     Comment: wine daily    Drug use: Yes     Types: Marijuana Sheran Fava)     Comment: THC/medical marijuana card    Sexual activity: Not Currently     Partners: Male     Social Determinants of Health     Physical Activity: Inactive (04/12/2022)    Exercise Vital Sign     Days of Exercise per Week: 0 days     Minutes of Exercise per Session: 0 min       Family History   Problem Relation Age of Onset    Amyotrophic lateral sclerosis Mother     High Blood Pressure Father     Thyroid Disease Father     Gout Father     Stroke Father     Cancer Sister         throat, esophagus, tongue, vaginal    Hemochromatosis Sister     Leukemia Brother     Hypertension Daughter     Thyroid Disease Daughter     Hemochromatosis Son     Liver Disease Son     Gout Son           Review of Systems    There were no vitals taken for this visit.    BP Readings from Last 3 Encounters:   08/06/22 (!) 150/95   07/19/22 (!) 143/83   06/28/22 128/78       Wt Readings from Last 3 Encounters:   08/06/22 86.4 kg (190 lb 8 oz)   07/19/22 89.2 kg (196 lb 11.2 oz)   06/28/22 90.9 kg (200 lb 6.4 oz)       Physical Exam    {[id number]}     Patient Active Problem List   Diagnosis    Bipolar I disorder (HCC)    Chronic diastolic CHF (congestive heart failure) (HCC)    Edema     Essential hypertension       ASSESSMENT/PLAN:    There are no diagnoses linked to this encounter.        Vilinda Boehringer, APRN - CNP  08/09/22  5:23 PM EDT

## 2022-08-10 ENCOUNTER — Encounter

## 2022-08-13 ENCOUNTER — Encounter

## 2022-08-14 NOTE — Telephone Encounter (Signed)
A first call was made in an attempt to reach this patient for a referral we received. I left a detailed voicemail to call our office to schedule an initial consult.    Ok to schedule with Dr. Margarita Grizzle - Lap band

## 2022-08-17 ENCOUNTER — Ambulatory Visit: Payer: MEDICARE | Primary: Family

## 2022-08-17 DIAGNOSIS — I5032 Chronic diastolic (congestive) heart failure: Secondary | ICD-10-CM

## 2022-08-21 ENCOUNTER — Telehealth: Admit: 2022-08-21 | Discharge: 2022-08-21 | Payer: MEDICARE | Attending: Family | Primary: Family

## 2022-08-21 ENCOUNTER — Encounter

## 2022-08-21 DIAGNOSIS — I10 Essential (primary) hypertension: Secondary | ICD-10-CM

## 2022-08-21 LAB — ECHO (TTE) COMPLETE (PRN CONTRAST/BUBBLE/STRAIN/3D)
AV Area by Peak Velocity: 2.2 cm2
AV Area by VTI: 2.2 cm2
AV Cusp Mmode: 2.2 cm
AV Mean Gradient: 4 mmHg
AV Mean Velocity: 0.9 m/s
AV Peak Gradient: 6 mmHg
AV Peak Velocity: 1.2 m/s
AV VTI: 31.3 cm
AV Velocity Ratio: 0.75
AVA/BSA Peak Velocity: 1.2 cm2/m2
AVA/BSA VTI: 1.2 cm2/m2
Ascending Aorta Index: 1.54 cm/m2
Ascending Aorta: 2.9 cm
Body Surface Area: 1.96 m2
EF Physician: 55 %
Fractional Shortening 2D: 27 % (ref 28–44)
IVSd: 1.3 cm — AB (ref 0.6–0.9)
IVSs: 1.6 cm
LA Diameter: 4.6 cm
LA Size Index: 2.45 cm/m2
LA Volume A-L A4C: 57 mL — AB (ref 22–52)
LA Volume A-L A4C: 76 mL — AB (ref 22–52)
LA Volume A/L: 74 mL
LA Volume Index A-L A2C: 40 mL/m2 — AB (ref 16–34)
LA Volume Index A-L A4C: 30 mL/m2 (ref 16–34)
LA Volume Index A/L: 39 mL/m2 (ref 16–34)
LA Volume Index MOD A2C: 39 ml/m2 — AB (ref 16–34)
LA Volume Index MOD A4C: 28 ml/m2 (ref 16–34)
LA Volume MOD A2C: 73 mL — AB (ref 22–52)
LA Volume MOD A4C: 52 mL (ref 22–52)
LV IVRT: 86.5 ms
LV Mass 2D Index: 157.8 g/m2 — AB (ref 43–95)
LV Mass 2D: 296.6 g — AB (ref 67–162)
LV RWT Ratio: 0.43
LVIDd Index: 2.98 cm/m2
LVIDd: 5.6 cm — AB (ref 3.9–5.3)
LVIDs Index: 2.18 cm/m2
LVIDs: 4.1 cm
LVOT Area: 3.1 cm2
LVOT Diameter: 2 cm
LVOT Mean Gradient: 2 mmHg
LVOT Peak Gradient: 3 mmHg
LVOT Peak Velocity: 0.9 m/s
LVOT SV: 71.6 ml
LVOT Stroke Volume Index: 38.1 mL/m2
LVOT VTI: 22.8 cm
LVOT:AV VTI Index: 0.73
LVPWd: 1.2 cm — AB (ref 0.6–0.9)
LVPWs: 1.5 cm
MV "A" Wave Duration: 148.8 msec
MV A Velocity: 0.85 m/s
MV Area by PHT: 3.2 cm2
MV Area by VTI: 2.2 cm2
MV E Velocity: 0.92 m/s
MV E Wave Deceleration Time: 269.4 ms
MV E/A: 1.08
MV Max Velocity: 1 m/s
MV Mean Gradient: 2 mmHg
MV Mean Velocity: 0.7 m/s
MV PHT: 68.4 ms
MV Peak Gradient: 4 mmHg
MV VTI: 32.6 cm
MV:LVOT VTI Index: 1.43
PV Max Velocity: 0.9 m/s
PV Mean Gradient: 2 mmHg
PV Mean Velocity: 0.7 m/s
PV Peak Gradient: 3 mmHg
PV VTI: 22.4 cm
Pulm Vein A Duration: 128 ms
Pulm Vein A Velocity: 0.3 m/s
Pulm Vein Peak D Velocity: 0.4 m/s
Pulm Vein Peak S Velocity: 0.6 m/s
Pulm Vein S/D: 1.5
RVIDd: 2.4 cm
TR Max Velocity: 2.26 m/s
TR Peak Gradient: 20 mmHg

## 2022-08-21 MED ORDER — DILTIAZEM HCL ER COATED BEADS 120 MG PO CP24
120 | ORAL_CAPSULE | Freq: Every day | ORAL | 1 refills | Status: DC
Start: 2022-08-21 — End: 2022-08-29

## 2022-08-21 NOTE — Progress Notes (Signed)
Chief Complaint   Patient presents with    Hypertension         An electronic signature was used to authenticate this note. Margaret Zimmerman, was evaluated through a synchronous (real-time) audio-video encounter. The patient (or guardian if applicable) is aware that this is a billable service, which includes applicable co-pays. This Virtual Visit was conducted with patient's (and/or legal guardian's) consent. Patient identification was verified, and a caregiver was present when appropriate.   The patient was located at Home: [address]  [place] [address]  Provider was located at Facility (Appt Dept): [address] [place],  [place] [address]  Confirm you are appropriately licensed, registered, or certified to deliver care in the state where the patient is located as indicated above. If you are not or unsure, please re-schedule the visit: Yes, I confirm.      Total time spent for this encounter: Not billed by time    Patient does agree to proceed with telemedicine consultation.    --Vilinda Boehringer, APRN - CNP on 08/21/2022 at 13:34 PM    An electronic signature was used to authenticate this note.      HPI    Hypertension  Her blood pressure has been fluctuating.  She has an episode of hypotension with a BP of 84/50.  She has had elevated blood pressures too with a BP of 144/86.  She has some stomach discomfort with the Coreg.  She has an elevated heart rate.  She was previously on Norvasc but it caused leg swelling.  Will stop Coreg and start Cardizem.      Patient Active Problem List   Diagnosis    Bipolar I disorder (HCC)    Chronic diastolic CHF (congestive heart failure) (HCC)    Edema    Essential hypertension    Anxiety    AKI (acute kidney injury) (HCC)    Bacterial pneumonia    Arthritis    Diverticula of intestine       Past Medical History:   Diagnosis Date    Bipolar 1 disorder (HCC)     Diverticular disease     Hypertension     Hypothyroidism     Spinal stenosis        Past Surgical History:    Procedure Laterality Date    APPENDECTOMY      BACK SURGERY      CHOLECYSTECTOMY      COLONOSCOPY      HYSTERECTOMY (CERVIX STATUS UNKNOWN)      KNEE SURGERY      torn meninscus    LAP BAND      LUMBAR LAMINECTOMY      OVARY REMOVAL         Current Outpatient Medications   Medication Sig Dispense Refill    dilTIAZem (CARDIZEM CD) 120 MG extended release capsule Take 1 capsule by mouth daily 30 capsule 1    torsemide (DEMADEX) 20 MG tablet Take 1 tablet by mouth in the morning and at bedtime 180 tablet 3    valsartan (DIOVAN) 320 MG tablet Take 1 tablet by mouth daily 30 tablet 5    levothyroxine (SYNTHROID) 125 MCG tablet Take 2 tablets by mouth Daily      Cholecalciferol (VITAMIN D3) 1000 units CAPS Take 1 capsule by mouth daily      Multiple Vitamin (MULTIVITAMIN ADULT PO) Take 1 capsule by mouth daily      B-Complex TABS Take 1 tablet by mouth daily  vitamin B-6 (PYRIDOXINE) 100 MG tablet Take 1 tablet by mouth daily      buPROPion (WELLBUTRIN XL) 150 MG extended release tablet Take 1 tablet by mouth every morning      lamoTRIgine (LAMICTAL) 200 MG tablet Take 1 tablet by mouth daily      zinc sulfate (ZINCATE) 220 (50 Zn) MG capsule Take 1 capsule by mouth daily      ALPRAZolam (XANAX) 1 MG tablet Take 1 tablet by mouth daily as needed for Sleep. 2 a day as needed       No current facility-administered medications for this visit.       No Known Allergies    Social History     Socioeconomic History    Marital status: Divorced     Spouse name: None    Number of children: None    Years of education: None    Highest education level: None   Tobacco Use    Smoking status: Former     Types: Cigarettes    Smokeless tobacco: Never    Tobacco comments:     Smoked on and off.  Haven't smoked for about 20 years   Vaping Use    Vaping Use: Every day    Substances: THC   Substance and Sexual Activity    Alcohol use: Yes     Comment: wine daily    Drug use: Yes     Types: Marijuana Sheran Fava)     Comment: THC/medical  marijuana card    Sexual activity: Not Currently     Partners: Male     Social Determinants of Health     Physical Activity: Inactive (04/12/2022)    Exercise Vital Sign     Days of Exercise per Week: 0 days     Minutes of Exercise per Session: 0 min       Family History   Problem Relation Age of Onset    Amyotrophic lateral sclerosis Mother     High Blood Pressure Father     Thyroid Disease Father     Gout Father     Stroke Father     Cancer Sister         throat, esophagus, tongue, vaginal    Hemochromatosis Sister     Leukemia Brother     Hypertension Daughter     Thyroid Disease Daughter     Hemochromatosis Son     Liver Disease Son     Gout Son           Review of Systems   Constitutional:  Negative for activity change and fatigue.   HENT:  Negative for congestion, postnasal drip and sore throat.    Eyes:  Negative for redness and visual disturbance.   Respiratory:  Negative for shortness of breath and wheezing.    Cardiovascular:  Negative for chest pain and leg swelling.   Gastrointestinal:  Negative for abdominal pain, constipation and diarrhea.   Genitourinary:  Negative for dysuria, flank pain and frequency.   Musculoskeletal:  Negative for arthralgias, back pain and myalgias.   Skin:  Negative for color change and rash.   Neurological:  Negative for dizziness and headaches.   Psychiatric/Behavioral:  Negative for sleep disturbance. The patient is not nervous/anxious.        There were no vitals taken for this visit.    BP Readings from Last 3 Encounters:   08/09/22 (!) 150/90   08/06/22 (!) 150/95   07/19/22 (!) 143/83  Wt Readings from Last 3 Encounters:   08/17/22 87.5 kg (193 lb)   08/09/22 87.5 kg (193 lb)   08/06/22 86.4 kg (190 lb 8 oz)       Physical Exam - not done due to virtual exam    CBC with Differential:    Lab Results   Component Value Date/Time    WBC 3.3 07/19/2022 08:52 AM    RBC 3.96 07/19/2022 08:52 AM    HGB 13.9 07/19/2022 08:52 AM    HCT 40.1 07/19/2022 08:52 AM    PLT 156  05/17/2022 11:19 AM    MCV 101.3 07/19/2022 08:52 AM    MCH 35.1 07/19/2022 08:52 AM    MCHC 34.7 07/19/2022 08:52 AM    RDW 11.4 07/19/2022 08:52 AM    LYMPHOPCT 44 07/19/2022 08:52 AM    MONOPCT 9 07/19/2022 08:52 AM    EOSPCT 3 07/19/2022 08:52 AM    BASOPCT 2 07/19/2022 08:52 AM    MONOSABS 0.31 07/19/2022 08:52 AM    EOSABS 0.11 07/19/2022 08:52 AM    BASOSABS 0.05 07/19/2022 08:52 AM     CMP:    Lab Results   Component Value Date/Time    NA 136 06/08/2022 12:05 PM    K 4.7 06/08/2022 12:05 PM    CL 98 06/08/2022 12:05 PM    CO2 24 06/08/2022 12:05 PM    BUN 16 06/08/2022 12:05 PM    CREATININE 1.0 06/08/2022 12:05 PM    GFRAA >60 12/09/2019 03:50 AM    LABGLOM 61 06/08/2022 12:05 PM    GLUCOSE 95 06/08/2022 12:05 PM    CALCIUM 9.8 06/08/2022 12:05 PM    BILITOT 0.5 06/08/2022 12:05 PM    ALKPHOS 113 06/08/2022 12:05 PM    AST 28 06/08/2022 12:05 PM    ALT 22 06/08/2022 12:05 PM        Patient Active Problem List   Diagnosis    Bipolar I disorder (HCC)    Chronic diastolic CHF (congestive heart failure) (HCC)    Edema    Essential hypertension    Anxiety    AKI (acute kidney injury) (HCC)    Bacterial pneumonia    Arthritis    Diverticula of intestine       ASSESSMENT/PLAN:    1. Primary hypertension  - dilTIAZem (CARDIZEM CD) 120 MG extended release capsule; Take 1 capsule by mouth daily  Dispense: 30 capsule; Refill: 1  - Stop Coreg  - Continue Diovan    Keep scheduled follow up      Vilinda Boehringer, APRN - CNP  08/21/22  1:34 PM EDT

## 2022-08-23 ENCOUNTER — Institutional Professional Consult (permissible substitution): Admit: 2022-08-23 | Discharge: 2022-08-23 | Payer: MEDICARE | Attending: Surgery | Primary: Family

## 2022-08-23 ENCOUNTER — Encounter

## 2022-08-23 DIAGNOSIS — K219 Gastro-esophageal reflux disease without esophagitis: Secondary | ICD-10-CM

## 2022-08-23 NOTE — Progress Notes (Signed)
[name]  08/23/2022  [facility]              History and Physical  EGD 09811    Margaret Zimmerman is a 72 y.o., female who weighs 87.1 kg (192 lb) Body mass index is 35.12 kg/m. with severe acid reflux many years post Lap band placement in [place]. Band was deflated which caused her to gain weight. Reflux is getting worse.    Weights:  192 lb 08/23/2022   180 lb 2010  276 lb 2008   Lap band placed in [place]      Past Medical History:   Diagnosis Date    Bipolar 1 disorder (HCC)     Diverticular disease     Hypertension     Hypothyroidism     Spinal stenosis        Past Surgical History:   Procedure Laterality Date    APPENDECTOMY      BACK SURGERY      CHOLECYSTECTOMY      COLONOSCOPY      HYSTERECTOMY (CERVIX STATUS UNKNOWN)      KNEE SURGERY      torn meninscus    LAP BAND      LUMBAR LAMINECTOMY      OVARY REMOVAL         Home Medications  Prior to Visit Medications    Medication Sig Taking? Authorizing Provider   omeprazole (PRILOSEC) 20 MG delayed release capsule Take 2 capsules by mouth daily Yes [provider]   dilTIAZem (CARDIZEM CD) 120 MG extended release capsule Take 1 capsule by mouth daily Yes Vilinda Boehringer, APRN - CNP   torsemide (DEMADEX) 20 MG tablet Take 1 tablet by mouth in the morning and at bedtime Yes Rolan Bucco, MD   valsartan (DIOVAN) 320 MG tablet Take 1 tablet by mouth daily Yes Vilinda Boehringer, APRN - CNP   levothyroxine (SYNTHROID) 125 MCG tablet Take 2 tablets by mouth Daily Yes [provider]   Cholecalciferol (VITAMIN D3) 1000 units CAPS Take 1 capsule by mouth daily Yes [provider]   Multiple Vitamin (MULTIVITAMIN ADULT PO) Take 1 capsule by mouth daily Yes [provider]   B-Complex TABS Take 1 tablet by mouth daily Yes [provider]   vitamin B-6 (PYRIDOXINE) 100 MG tablet Take 1 tablet by mouth daily Yes [provider]   buPROPion (WELLBUTRIN XL) 150 MG extended release tablet Take 1 tablet  by mouth every morning Yes [provider]   lamoTRIgine (LAMICTAL) 200 MG tablet Take 1 tablet by mouth daily Yes [provider]   zinc sulfate (ZINCATE) 220 (50 Zn) MG capsule Take 1 capsule by mouth daily Yes [provider]   ALPRAZolam (XANAX) 1 MG tablet Take 1 tablet by mouth daily as needed for Sleep. 2 a day as needed Yes [provider]       Allergies: Patient has no known allergies.   Social History:   TOBACCO:   reports that she has quit smoking. Her smoking use included cigarettes. She has never used smokeless tobacco.  All smokers must join the free smoking cessation program and stop smoking for 3 months before having any Bariatric surgery.  ETOH:    reports current alcohol use.   Family History   Problem Relation Age of Onset    Amyotrophic lateral sclerosis Mother  High Blood Pressure Father     Thyroid Disease Father     Gout Father     Stroke Father     Cancer Sister         throat, esophagus, tongue, vaginal    Hemochromatosis Sister     Leukemia Brother     Hypertension Daughter     Thyroid Disease Daughter     Hemochromatosis Son     Liver Disease Son     Gout Son      Review of Systems:  Psychiatric:  depression and anxiety  Respiratory: negative  Cardiovascular: negative  Gastrointestinal: negative  Musculoskeletal:negative  All others reviewed, negative    Physical Exam:   VITALS: Blood pressure (!) 130/92, pulse 98, height 1.575 m (5\' 2" ), weight 87.1 kg (192 lb).  General appearance: alert, appears stated age and cooperative, does ambulate easily  Head: Normocephalic, without obvious abnormality, atraumatic  Eyes: PERRL  Ears/mouth/throat:  Ears clear, mouth normal, throat no redness  Neck: no adenopathy, no JVD, supple, symmetrical, trachea midline and thyroid not enlarged  Lungs: clear to auscultation bilaterally  Heart: regular rate and rhythm  Abdomen: soft, non-tender; bowel sounds normal; no masses,  no organomegaly, port palpable  RUQ  Extremities: extremities normal, atraumatic, no cyanosis or edema  Skin: no open wounds    Assessment:  Reflux  Band still preventing her from eating too fast or too much  Carbonated drinks cause her to throw up, happens once a month.    Plan:  We will schedule an EGD.      Physician Signature: Electronically signed by Dr. Chanetta Marshall MD    Send copy to Vilinda Boehringer, APRN - CNP

## 2022-08-23 NOTE — Telephone Encounter (Signed)
Prior Authorization Form  DEMOGRAPHICS:    Patient Name:  STACI DACK  Patient DOB:  16-Dec-1950            Insurance:  Payor: MEDICARE / Plan: MEDICARE PART A AND B / Product Type: *No Product type* /   Insurance ID Number:    Payer/Plan Subscr DOB Sex Relation Sub. Ins. ID Effective Group Num   1. MEDICARE - MENEIDY, GUERRIERI Aug 31, 1950 Female Self [id number] 10/03/17                                    [address] [address]   2. [place] [facility]* MARY-ANN, PENNELLA 01/16/1951 Female Self [id number] 03/06/19 01                                   [id number]         DIAGNOSIS & PROCEDURE:    Procedure/Operation: egd           CPT Code: 95284    Diagnosis:  Gastric band malfunction    ICD10 Code: k95.09    [facility]    Surgeon:  Chryl Heck INFORMATION:    Date: 09/04/22    Time:               Anesthesia:  MAC/TIVA                                                       Status:  Outpatient        Special Comments:         Electronically signed by Arlyce Dice, MA on 08/23/2022 at 8:58 AM

## 2022-08-23 NOTE — Patient Instructions (Signed)
Please continue to take your vitamin and mineral supplements as instructed.      If you received a blood work prescription today for laboratory monitoring due prior to your next routine follow-up visit, please have this blood work obtained 10 to 14 days prior to your next visit.  It is important to fast for 12 hours prior to routine weight loss surgery blood work, EXCEPT for drinking water, to ensure accuracy of results.    Please report nausea, vomiting, abdominal pain, or any other problems you experience to your surgeon.  For problems related to weight loss surgery, it is best to go to [facility] Emergency Department and have your surgeon paged.    Last Surgical Weight Loss:       No data to display

## 2022-08-29 ENCOUNTER — Encounter

## 2022-08-30 MED ORDER — DILTIAZEM HCL ER COATED BEADS 120 MG PO CP24
120 | ORAL_CAPSULE | Freq: Every day | ORAL | 1 refills | Status: DC
Start: 2022-08-30 — End: 2022-08-29

## 2022-08-30 MED ORDER — DILTIAZEM HCL ER COATED BEADS 120 MG PO CP24
120 | ORAL_CAPSULE | Freq: Every day | ORAL | 1 refills | Status: AC
Start: 2022-08-30 — End: ?

## 2022-09-03 NOTE — Progress Notes (Signed)
[facility]                                                                                                                    PRE OP INSTRUCTIONS FOR  Margaret Zimmerman        Date: 09/03/2022    Date of surgery: 09/04/2022   Arrival Time: Hospital will call you between 5pm and 7pm with your final arrival time for surgery. Go to     front of hospital and check in at information desk.    Nothing by mouth (NPO) as instructed. May have clear liquids up to 2 hours prior to surgery. Nothing solid after midnight. Examples: water, apple juice, black coffee, plain tea    Take the following medications with a small sip of water on the morning of Surgery: cardizem, synthroid, xanax    Diabetics may take half the evening dose of insulin but none after midnight.  If you feel symptomatic or have low blood sugar morning of surgery drink 1-2 ounces of apple juice only. If you take a weekly insulin injection _______________, stop 7 days prior to surgery. If you take _______________, stop 3-4 days prior to surgery.    Aspirin, Ibuprofen, Advil, Naproxen, other Anti-inflammatory products should be stopped before surgery as directed by your surgeon, cardiologist, or primary care Doctor. Herbal supplements and Vitamin E should be stopped five days prior.  May take Tylenol unless instructed otherwise by your surgeon.    Check with your Doctor regarding stopping Plavix, Coumadin, Lovenox, Eliquis, Effient, or other blood thinners such as, pradaxa, lixiana, xaralto and savaysa.    Do not smoke, vape, or use illicit drugs and do not drink any alcoholic beverages 24 hours prior to surgery.    You may brush your teeth the morning of surgery.      You MUST make arrangements for a responsible adult, 18 and over, to take you home after your surgery. You will not be allowed to leave alone or drive yourself home.  You will need someone stay with you the first 24 hrs. Your surgery will be cancelled if you do not have a ride  home or someone to stay with you.    PEDIATRIC PATIENTS ONLY:  A parent/legal guardian must accompany a child scheduled for surgery and plan to stay at the hospital until the child is discharged.  Please do not bring other children with you.    Please wear simple, loose fitting clothing to the hospital.  Do not bring valuables (money, credit cards, checkbooks, etc.) Do not wear any makeup (including no eye makeup) or nail polish on your fingers or toes.    DO NOT wear any jewelry or piercings on day of surgery.  All body piercings and jewelry must be removed.    Shower the night before surgery with an ___ Antibacterial soap /antibacterial (Sage) wipes _______    HYSTERECTOMY patients ONLY - Remember to bring the green Blood Bank bracelet to the hospital on the  day of surgery.    If you have a Living Will and/or Durable Power of Attorney for Healthcare, please bring in a copy.    If appropriate bring crutches, inspirex, walker, cane etc...    Notify your Surgeon if you develop any illness between now and surgery time, cough, cold, fever, sore throat, nausea, vomiting, etc.  Please notify your surgeon if you experience dizziness, shortness of breath or blurred vision between now & the time of your surgery.    If you wear dentures, they will need to be removed before going into surgery, we will provide you with a container. If you wear contact lenses or glasses, they will need to be removed, please bring a case for them.    To provide excellent care visitors will be limited to 1 person in the room at any given time.                                                                                         No children under the age of 37 are permitted in the hospital for the safety of all patients.     Any implantable device requiring remote therapy, Please bring remote day of surgery and bring your implant card with you!      21. If you use a C-PAP please bring settings with you, do not bring the machine.    22. Other:                      Please call AMBULATORY CARE if you have any further questions.   [place]     [place]

## 2022-09-04 ENCOUNTER — Inpatient Hospital Stay: Payer: MEDICARE | Attending: Surgery

## 2022-09-04 MED ORDER — LACTATED RINGERS IV SOLN
INTRAVENOUS | Status: DC
Start: 2022-09-04 — End: 2022-09-04
  Administered 2022-09-04: 15:00:00 via INTRAVENOUS

## 2022-09-04 MED ORDER — PROPOFOL 200 MG/20ML IV EMUL
200 | INTRAVENOUS | Status: AC
Start: 2022-09-04 — End: ?

## 2022-09-04 MED ORDER — PROPOFOL 200 MG/20ML IV EMUL
200 MG/20ML | INTRAVENOUS | Status: DC | PRN
  Administered 2022-09-04 (×2): 50 via INTRAVENOUS
  Administered 2022-09-04: 15:00:00 100 via INTRAVENOUS

## 2022-09-04 MED FILL — DIPRIVAN 200 MG/20ML IV EMUL: 200 MG/20ML | INTRAVENOUS | Qty: 20

## 2022-09-04 NOTE — Progress Notes (Signed)
Sbar completed and placed on chart. Chart with patient in transit.

## 2022-09-04 NOTE — Anesthesia Post-Procedure Evaluation (Signed)
Department of Anesthesiology  Postprocedure Note    Patient: Margaret Zimmerman  MRN: [id number]  Birthdate: Sep 06, 1950  Date of evaluation: 09/04/2022    Procedure Summary       Date: 09/04/22 Room / Location: [facility] ENDO 01 / [facility]    Anesthesia Start: 1123 Anesthesia Stop: 1135    Procedure: ESOPHAGOGASTRODUODENOSCOPY BIOPSY Diagnosis:       Complication of gastric banding      (Complication of gastric banding [K95.09])    Surgeons: Arman Filter, MD Responsible Provider: Judieth Keens, MD    Anesthesia Type: MAC ASA Status: 3            Anesthesia Type: MAC    Aldrete Phase I: Aldrete Score: 10    Aldrete Phase II: Aldrete Score: 10    Anesthesia Post Evaluation    Patient location during evaluation: bedside  Patient participation: complete - patient participated  Level of consciousness: awake and alert  Pain score: 2  Airway patency: patent  Nausea & Vomiting: no nausea  Cardiovascular status: blood pressure returned to baseline  Respiratory status: acceptable  Hydration status: euvolemic  Pain management: adequate    No notable events documented.

## 2022-09-04 NOTE — Op Note (Signed)
Margaret Zimmerman  [facility]. [facility]    Operative Report    DATE OF PROCEDURE: 09/04/2022    SURGEON: Dr. Almond Lint, MD     Surgical Assistant: [name], RN     PRE-OP DIAGNOSIS:     Bipolar I disorder [facility])     CHF (congestive heart failure) (HCC)     Essential hypertension     Anxiety     Arthritis     Complication of gastric banding     POSTOPERATIVE DIAGNOSES:     EGD 09/04/2022 showed mild esophagitis, 1 cm hiatal hernia, 2 cm band slip but no band erosion, she does complain of acid reflux, biopsy done to check for H.pylori     OPERATION:    EGD with biopsy                 ANESTHESIA: LMAC.  BLOOD LOSS: none  SPECIMEN: gastric mucosa    CONSENT AND INDICATIONS:  This is a 72 y.o. year old female who has a band in place. She needs to have an EGD as part of the work up. I have discussed with the patient the indication, alternatives, and the possible risks and/or complications of the planned procedure and the anesthesia methods. The patient and/or patient representative appear to understand and agree to proceed.    Monitoring and Safety: The patient was placed on a cardiac monitor and vital signs, pulse oximetry and level of consciousness were continuously evaluated throughout the procedure. The patient was closely monitored until recovery from the medications was complete and the patient had returned to baseline status. Respiratory therapy was on standby at all times during the procedure.     Operation: The patient was placed on the table and sedated by Anaesthesia.  Bite block was placed. A lubricated scope was easily passed into the upper esophagus which looked normal. The distal esophagus had mild inflammation.  The Z-line was measured at 36 cm.  The scope was passed into the 2 cm stomach pouch above the band.  The scope was passed through the band and retroflexed, no erosion was seen. The scope was passed into the distal stomach and down toward the pylorus. The antral  mucosa was normal. Biopsy was taken to check for H. pylori. The scope was then passed through the pylorus into the duodenal bulb which looked normal, then around to the distal duodenum which looked normal, and the scope was then withdrawn. The patient tolerated the procedure well.    Complications: none    Plan:   She may proceed with band removal.    Electronically signed Dr. Chanetta Marshall. M.D.

## 2022-09-04 NOTE — H&P (Signed)
Margaret Zimmerman  09/04/2022  [facility]              History and Physical    Margaret Zimmerman is a 72 y.o., female who weighs 87.1 kg (192 lb) Body mass index is 35.12 kg/m. with severe acid reflux many years post Lap band placement in [place]. Band was deflated which caused her to gain weight. Reflux is getting worse.    Weights:  192 lb 08/23/2022   180 lb 2010  276 lb 2008   Lap band placed in [place]      Past Medical History:   Diagnosis Date    Bipolar 1 disorder (HCC)     Diverticular disease     Hypertension     Hypothyroidism     Spinal stenosis        Past Surgical History:   Procedure Laterality Date    APPENDECTOMY      BACK SURGERY      CHOLECYSTECTOMY      CHOLECYSTECTOMY      COLONOSCOPY      HYSTERECTOMY (CERVIX STATUS UNKNOWN)      KNEE SURGERY      torn meninscus    LAP BAND      LUMBAR LAMINECTOMY      OVARY REMOVAL         Home Medications  Prior to Visit Medications    Medication Sig Taking? Authorizing Provider   dilTIAZem (CARDIZEM CD) 120 MG extended release capsule Take 1 capsule by mouth daily  Vilinda Boehringer, APRN - CNP   omeprazole (PRILOSEC) 20 MG delayed release capsule Take 2 capsules by mouth daily  [provider]   torsemide (DEMADEX) 20 MG tablet Take 1 tablet by mouth in the morning and at bedtime  Patient taking differently: Take 2 tablets by mouth daily  Rolan Bucco, MD   valsartan (DIOVAN) 320 MG tablet Take 1 tablet by mouth daily  Vilinda Boehringer, APRN - CNP   levothyroxine (SYNTHROID) 125 MCG tablet Take 2 tablets by mouth Daily  [provider]   Cholecalciferol (VITAMIN D3) 1000 units CAPS Take 1 capsule by mouth daily  [provider]   Multiple Vitamin (MULTIVITAMIN ADULT PO) Take 1 capsule by mouth daily  [provider]   B-Complex TABS Take 1 tablet by mouth daily  [provider]   vitamin B-6 (PYRIDOXINE) 100 MG tablet Take 1 tablet by mouth daily  [provider]   buPROPion (WELLBUTRIN XL) 150  MG extended release tablet Take 1 tablet by mouth every morning  [provider]   lamoTRIgine (LAMICTAL) 200 MG tablet Take 1 tablet by mouth daily  [provider]   zinc sulfate (ZINCATE) 220 (50 Zn) MG capsule Take 1 capsule by mouth daily  [provider]   ALPRAZolam Prudy Feeler) 1 MG tablet Take 1 tablet by mouth daily as needed for Anxiety. 2 a day as needed  [provider]       Allergies: Patient has no known allergies.   Social History:   TOBACCO:   reports that she has quit smoking. Her smoking use included cigarettes. She has never used smokeless tobacco.  All smokers must join the free smoking cessation program and stop smoking for 3 months before having any Bariatric surgery.  ETOH:    reports current alcohol use.   Family History   Problem Relation  Age of Onset    Amyotrophic lateral sclerosis Mother     High Blood Pressure Father     Thyroid Disease Father     Gout Father     Stroke Father     Cancer Sister         throat, esophagus, tongue, vaginal    Hemochromatosis Sister     Leukemia Brother     Hypertension Daughter     Thyroid Disease Daughter     Hemochromatosis Son     Liver Disease Son     Gout Son      Review of Systems:  Psychiatric:  depression and anxiety  Respiratory: negative  Cardiovascular: negative  Gastrointestinal: negative  Musculoskeletal:negative  All others reviewed, negative    Physical Exam:   VITALS: Blood pressure (!) 162/74, pulse 65, temperature 97.8 F (36.6 C), temperature source Infrared, resp. rate 18, height 1.575 m (5\' 2" ), weight 87.1 kg (192 lb), SpO2 99 %.  General appearance: alert, appears stated age and cooperative, does ambulate easily  Head: Normocephalic, without obvious abnormality, atraumatic  Eyes: PERRL  Ears/mouth/throat:  Ears clear, mouth normal, throat no redness  Neck: no adenopathy, no JVD, supple, symmetrical, trachea midline and thyroid not enlarged  Lungs: clear to auscultation bilaterally  Heart: regular rate  and rhythm  Abdomen: soft, non-tender; bowel sounds normal; no masses,  no organomegaly, port palpable RUQ  Extremities: extremities normal, atraumatic, no cyanosis or edema  Skin: no open wounds    Assessment:  Reflux  Band still preventing her from eating too fast or too much  Carbonated drinks cause her to throw up, happens once a month.    Plan:  Upper endoscopy      Physician Signature: Electronically signed by Dr. Chanetta Marshall MD    Send copy to Vilinda Boehringer, APRN - CNP

## 2022-09-04 NOTE — Anesthesia Pre-Procedure Evaluation (Signed)
Department of Anesthesiology  Preprocedure Note       Name:  Margaret Zimmerman   Age:  72 y.o.  DOB:  1950-12-08                                          MRN:  [id number]         Date:  09/04/2022      Surgeon: Moishe Spice):  Arman Filter, MD    Procedure: Procedure(s):  ESOPHAGOGASTRODUODENOSCOPY    Medications prior to admission:   Prior to Admission medications    Medication Sig Start Date End Date Taking? Authorizing Provider   dilTIAZem (CARDIZEM CD) 120 MG extended release capsule Take 1 capsule by mouth daily 08/29/22   Vilinda Boehringer, APRN - CNP   omeprazole (PRILOSEC) 20 MG delayed release capsule Take 2 capsules by mouth daily    [provider]   torsemide (DEMADEX) 20 MG tablet Take 1 tablet by mouth in the morning and at bedtime  Patient taking differently: Take 2 tablets by mouth daily 07/18/22   Rolan Bucco, MD   valsartan (DIOVAN) 320 MG tablet Take 1 tablet by mouth daily 05/21/22   Vilinda Boehringer, APRN - CNP   levothyroxine (SYNTHROID) 125 MCG tablet Take 2 tablets by mouth Daily    [provider]   Cholecalciferol (VITAMIN D3) 1000 units CAPS Take 1 capsule by mouth daily    [provider]   Multiple Vitamin (MULTIVITAMIN ADULT PO) Take 1 capsule by mouth daily    [provider]   B-Complex TABS Take 1 tablet by mouth daily    [provider]   vitamin B-6 (PYRIDOXINE) 100 MG tablet Take 1 tablet by mouth daily    [provider]   buPROPion (WELLBUTRIN XL) 150 MG extended release tablet Take 1 tablet by mouth every morning    [provider]   lamoTRIgine (LAMICTAL) 200 MG tablet Take 1 tablet by mouth daily    [provider]   zinc sulfate (ZINCATE) 220 (50 Zn) MG capsule Take 1 capsule by mouth daily    [provider]   ALPRAZolam Prudy Feeler) 1 MG tablet Take 1 tablet by mouth daily as needed for Anxiety. 2 a day as needed    [provider]       Current medications:    Current Facility-Administered  Medications   Medication Dose Route Frequency Provider Last Rate Last Admin    lactated ringers IV soln infusion   IntraVENous Continuous Arman Filter, MD           Allergies:  No Known Allergies    Problem List:    Patient Active Problem List   Diagnosis Code    Bipolar I disorder (HCC) F31.9    Chronic diastolic CHF (congestive heart failure) (HCC) I50.32    Edema R60.9    Essential hypertension I10    Anxiety F41.9    AKI (acute kidney injury) (HCC) N17.9    Bacterial pneumonia J15.9    Arthritis M19.90    Diverticula of intestine K57.30    Complication of gastric banding K95.09       Past Medical History:        Diagnosis Date    Bipolar 1 disorder (HCC)     Diverticular disease     Hypertension     Hypothyroidism     Spinal stenosis  Past Surgical History:        Procedure Laterality Date    APPENDECTOMY      BACK SURGERY      CHOLECYSTECTOMY      CHOLECYSTECTOMY      COLONOSCOPY      HYSTERECTOMY (CERVIX STATUS UNKNOWN)      KNEE SURGERY      torn meninscus    LAP BAND      LUMBAR LAMINECTOMY      OVARY REMOVAL         Social History:    Social History     Tobacco Use    Smoking status: Former     Types: Cigarettes    Smokeless tobacco: Never    Tobacco comments:     Smoked on and off.  Haven't smoked for about 20 years   Substance Use Topics    Alcohol use: Yes     Comment: wine daily                                Counseling given: Not Answered  Tobacco comments: Smoked on and off.  Haven't smoked for about 20 years      Vital Signs (Current):   Vitals:    09/04/22 1024   BP: (!) 162/74   Pulse: 65   Resp: 18   Temp: 36.6 C (97.8 F)   TempSrc: Infrared   SpO2: 99%   Weight: 87.1 kg (192 lb)   Height: 1.575 m (5\' 2" )                                              BP Readings from Last 3 Encounters:   09/04/22 (!) 162/74   08/23/22 (!) 130/92   08/09/22 (!) 150/90       NPO Status: Time of last liquid consumption: 0600                        Time of last solid consumption: 1830                         Date of last liquid consumption: 09/04/22                        Date of last solid food consumption: 09/03/22    BMI:   Wt Readings from Last 3 Encounters:   09/04/22 87.1 kg (192 lb)   08/23/22 87.1 kg (192 lb)   08/17/22 87.5 kg (193 lb)     Body mass index is 35.12 kg/m.    CBC:   Lab Results   Component Value Date/Time    WBC 3.3 07/19/2022 08:52 AM    RBC 3.96 07/19/2022 08:52 AM    HGB 13.9 07/19/2022 08:52 AM    HCT 40.1 07/19/2022 08:52 AM    MCV 101.3 07/19/2022 08:52 AM    RDW 11.4 07/19/2022 08:52 AM    PLT 156 05/17/2022 11:19 AM       CMP:   Lab Results   Component Value Date/Time    NA 136 06/08/2022 12:05 PM    K 4.7 06/08/2022 12:05 PM    CL 98 06/08/2022 12:05 PM    CO2 24 06/08/2022 12:05 PM  BUN 16 06/08/2022 12:05 PM    CREATININE 1.0 06/08/2022 12:05 PM    GFRAA >60 12/09/2019 03:50 AM    LABGLOM 61 06/08/2022 12:05 PM    GLUCOSE 95 06/08/2022 12:05 PM    CALCIUM 9.8 06/08/2022 12:05 PM    BILITOT 0.5 06/08/2022 12:05 PM    ALKPHOS 113 06/08/2022 12:05 PM    AST 28 06/08/2022 12:05 PM    ALT 22 06/08/2022 12:05 PM       POC Tests: No results for input(s): "POCGLU", "POCNA", "POCK", "POCCL", "POCBUN", "POCHEMO", "POCHCT" in the last 72 hours.    Coags:   Lab Results   Component Value Date/Time    PROTIME 11.3 05/17/2022 11:19 AM    INR 1.0 05/17/2022 11:19 AM       HCG (If Applicable): No results found for: "PREGTESTUR", "PREGSERUM", "HCG", "HCGQUANT"     ABGs: No results found for: "PHART", "PO2ART", "PCO2ART", "HCO3ART", "BEART", "O2SATART"     Type & Screen (If Applicable):  No results found for: "LABABO"    Drug/Infectious Status (If Applicable):  Lab Results   Component Value Date/Time    HEPCAB NONREACTIVE 07/19/2022 08:52 AM       COVID-19 Screening (If Applicable): No results found for: "COVID19"        Anesthesia Evaluation  Patient summary reviewed and Nursing notes reviewed  Airway: Mallampati: II  TM distance: >3 FB   Neck ROM: full  Mouth opening: > = 3 FB   Dental:           Pulmonary:   (+) pneumonia:                                      Cardiovascular:    (+) hypertension:, CHF:        Rhythm: regular  Rate: normal                    Neuro/Psych:   (+) psychiatric history:            GI/Hepatic/Renal:             Endo/Other:    (+) hypothyroidism::..                 Abdominal:             Vascular:          Other Findings:             Anesthesia Plan      MAC     ASA 3       Induction: intravenous.      Anesthetic plan and risks discussed with patient.                        Wilford Sports, APRN - CRNA   09/04/2022

## 2022-09-04 NOTE — Discharge Instructions (Signed)
Upper GI Endoscopy: What to Expect at Home  Your Recovery  You had an upper GI endoscopy. Your doctor used a thin, lighted tube that bends to look at the inside of your esophagus, your stomach, and the first part of the small intestine, called the duodenum.  After you have an endoscopy, you will stay at the hospital or clinic for 1 to 2 hours. This will allow the medicine to wear off. You will be able to go home after your doctor or nurse checks to make sure that you're not having any problems.  You may have to stay overnight if you had treatment during the test. You may have a sore throat for a day or two after the test.  This care sheet gives you a general idea about what to expect after the test.  How can you care for yourself at home?  Activity   Rest as much as you need to after you go home.  You should be able to go back to your usual activities the day after the test.  Diet   Follow your doctor's directions for eating after the test.  Drink plenty of fluids (unless your doctor has told you not to).  Medications   If you have a sore throat the day after the test, use an over-the-counter spray to numb your throat.  Follow-up care is a key part of your treatment and safety. Be sure to make and go to all appointments, and call your doctor if you are having problems. It's also a good idea to know your test results and keep a list of the medicines you take.  When should you call for help?   Call 911 anytime you think you may need emergency care. For example, call if:    You passed out (lost consciousness).     You have trouble breathing.     You pass maroon or bloody stools.   Call your doctor now or seek immediate medical care if:    You have pain that does not get better after your take pain medicine.     You have new or worse belly pain.     You have blood in your stools.     You are sick to your stomach and cannot keep fluids down.     You have a fever.     You cannot pass stools or gas.   Watch closely  for changes in your health, and be sure to contact your doctor if:    Your throat still hurts after a day or two.     You do not get better as expected.   Where can you learn more?  Go to https://www.healthwise.net/patientEd and enter J454 to learn more about "Upper GI Endoscopy: What to Expect at Home."  Current as of: December 21, 2021  Content Version: 14.1   2006-2024 [employer].   Care instructions adapted under license by [facility]. If you have questions about a medical condition or this instruction, always ask your healthcare professional. [employer] disclaims any warranty or liability for your use of this information.

## 2022-09-05 ENCOUNTER — Encounter

## 2022-09-05 LAB — HEPATIC FUNCTION PANEL
ALT: 32 U/L (ref 0–32)
AST: 56 U/L — ABNORMAL HIGH (ref 0–31)
Albumin: 4.3 g/dL (ref 3.5–5.2)
Alkaline Phosphatase: 141 U/L — ABNORMAL HIGH (ref 35–104)
Bilirubin, Direct: <0.2 mg/dL (ref 0.0–0.3)
Total Bilirubin: 0.6 mg/dL (ref 0.0–1.2)
Total Protein: 7 g/dL (ref 6.4–8.3)

## 2022-09-05 LAB — CLO TEST: Heliobacter Pylori ID: NEGATIVE

## 2022-09-05 LAB — CBC WITH AUTO DIFFERENTIAL
Basophils %: 2 % (ref 0.0–2.0)
Basophils Absolute: 0.07 10*3/uL (ref 0.00–0.20)
Eosinophils %: 3 % (ref 0–6)
Eosinophils Absolute: 0.11 10*3/uL (ref 0.05–0.50)
Hematocrit: 40.7 % (ref 34.0–48.0)
Hemoglobin: 13.7 g/dL (ref 11.5–15.5)
Immature Granulocytes %: 1 % (ref 0.0–5.0)
Immature Granulocytes Absolute: 0.03 10*3/uL (ref 0.00–0.58)
Lymphocytes %: 41 % (ref 20.0–42.0)
Lymphocytes Absolute: 1.84 10*3/uL (ref 1.50–4.00)
MCH: 34.9 pg (ref 26.0–35.0)
MCHC: 33.7 g/dL (ref 32.0–34.5)
MCV: 103.8 fL — ABNORMAL HIGH (ref 80.0–99.9)
MPV: 9.8 fL (ref 7.0–12.0)
Monocytes %: 14 % — ABNORMAL HIGH (ref 2.0–12.0)
Monocytes Absolute: 0.6 10*3/uL (ref 0.10–0.95)
Neutrophils %: 40 % — ABNORMAL LOW (ref 43.0–80.0)
Neutrophils Absolute: 1.79 10*3/uL — ABNORMAL LOW (ref 1.80–7.30)
Platelets: 158 10*3/uL (ref 130–450)
RBC: 3.92 m/uL (ref 3.50–5.50)
RDW: 12.8 % (ref 11.5–15.0)
WBC: 4.4 10*3/uL — ABNORMAL LOW (ref 4.5–11.5)

## 2022-09-05 LAB — BASIC METABOLIC PANEL
Anion Gap: 15 mmol/L (ref 7–16)
BUN: 16 mg/dL (ref 6–23)
CO2: 25 mmol/L (ref 22–29)
Calcium: 9.3 mg/dL (ref 8.6–10.2)
Chloride: 94 mmol/L — ABNORMAL LOW (ref 98–107)
Creatinine: 1.1 mg/dL — ABNORMAL HIGH (ref 0.50–1.00)
Est, Glom Filt Rate: 57 mL/min/{1.73_m2} — ABNORMAL LOW (ref >60)
Glucose: 91 mg/dL (ref 74–99)
Potassium: 4.4 mmol/L (ref 3.5–5.0)
Sodium: 134 mmol/L (ref 132–146)

## 2022-09-20 ENCOUNTER — Ambulatory Visit: Admit: 2022-09-20 | Discharge: 2022-09-20 | Payer: MEDICARE | Attending: Surgery | Primary: Family

## 2022-09-20 DIAGNOSIS — K219 Gastro-esophageal reflux disease without esophagitis: Secondary | ICD-10-CM

## 2022-09-20 NOTE — Patient Instructions (Signed)
Please continue to take your vitamin and mineral supplements as instructed.      If you received a blood work prescription today for laboratory monitoring due prior to your next routine follow-up visit, please have this blood work obtained 10 to 14 days prior to your next visit.  It is important to fast for 12 hours prior to routine weight loss surgery blood work, EXCEPT for drinking water, to ensure accuracy of results.    Please report nausea, vomiting, abdominal pain, or any other problems you experience to your surgeon.  For problems related to weight loss surgery, it is best to go to [facility] Emergency Department and have your surgeon paged.    Last Surgical Weight Loss:       No data to display

## 2022-09-20 NOTE — Progress Notes (Signed)
Margaret Zimmerman  09/20/2022  [facility]              History and Physical  EGD 62130    Margaret Zimmerman is a 72 y.o., female who weighs 87.5 kg (193 lb) Body mass index is 35.3 kg/m. EGD 09/04/2022 showed mild esophagitis, 1 cm hiatal hernia, 2 cm band slip but no band erosion, she does complain of acid reflux, biopsy done to check for H.pylori. she has had severe acid reflux many years post Lap band placement in [place]. Band was deflated in the past which caused her to gain weight. Reflux still getting worse.    Weights:  192 lb 08/23/2022   180 lb 2010  276 lb 2008   Lap band placed in [place]      Past Medical History:   Diagnosis Date    Bipolar 1 disorder (HCC)     Diverticular disease     Hypertension     Hypothyroidism     Spinal stenosis        Past Surgical History:   Procedure Laterality Date    APPENDECTOMY      BACK SURGERY      CHOLECYSTECTOMY      CHOLECYSTECTOMY      COLONOSCOPY      HYSTERECTOMY (CERVIX STATUS UNKNOWN)      KNEE SURGERY      torn meninscus    LAP BAND      LUMBAR LAMINECTOMY      OVARY REMOVAL      UPPER GASTROINTESTINAL ENDOSCOPY N/A 09/04/2022    ESOPHAGOGASTRODUODENOSCOPY BIOPSY performed by Arman Filter, MD at [facility] ENDOSCOPY       Home Medications  Prior to Visit Medications    Medication Sig Taking? Authorizing Provider   dilTIAZem (CARDIZEM CD) 120 MG extended release capsule Take 1 capsule by mouth daily Yes Vilinda Boehringer, APRN - CNP   omeprazole (PRILOSEC) 20 MG delayed release capsule Take 2 capsules by mouth daily Yes [provider]   torsemide (DEMADEX) 20 MG tablet Take 1 tablet by mouth in the morning and at bedtime  Patient taking differently: Take 2 tablets by mouth daily Yes Rolan Bucco, MD   valsartan (DIOVAN) 320 MG tablet Take 1 tablet by mouth daily Yes Vilinda Boehringer, APRN - CNP   levothyroxine (SYNTHROID) 125 MCG tablet Take 2 tablets by mouth Daily Yes [provider]   Cholecalciferol (VITAMIN D3) 1000 units CAPS Take 1  capsule by mouth daily Yes [provider]   Multiple Vitamin (MULTIVITAMIN ADULT PO) Take 1 capsule by mouth daily Yes [provider]   B-Complex TABS Take 1 tablet by mouth daily Yes [provider]   vitamin B-6 (PYRIDOXINE) 100 MG tablet Take 1 tablet by mouth daily Yes [provider]   buPROPion (WELLBUTRIN XL) 150 MG extended release tablet Take 1 tablet by mouth every morning Yes [provider]   lamoTRIgine (LAMICTAL) 200 MG tablet Take 1 tablet by mouth daily Yes [provider]   zinc sulfate (ZINCATE) 220 (50 Zn) MG capsule Take 1 capsule by mouth daily Yes [provider]   ALPRAZolam (XANAX) 1 MG tablet Take 1 tablet by mouth daily as needed for Anxiety. 2 a day as needed Yes [provider]       Allergies: Patient has no known allergies.   Social History:  TOBACCO:   reports that she has quit smoking. Her smoking use included cigarettes. She has never used smokeless tobacco.  All smokers must join the free smoking cessation program and stop smoking for 3 months before having any Bariatric surgery.  ETOH:    reports current alcohol use.   Family History   Problem Relation Age of Onset    Amyotrophic lateral sclerosis Mother     High Blood Pressure Father     Thyroid Disease Father     Gout Father     Stroke Father     Cancer Sister         throat, esophagus, tongue, vaginal    Hemochromatosis Sister     Leukemia Brother     Hypertension Daughter     Thyroid Disease Daughter     Hemochromatosis Son     Liver Disease Son     Gout Son      Review of Systems:  Psychiatric:  depression and anxiety  Respiratory: negative  Cardiovascular: negative  Gastrointestinal: negative  Musculoskeletal:negative  All others reviewed, negative    Physical Exam:   VITALS: Blood pressure 132/80, pulse 98, height 1.575 m (5\' 2" ), weight 87.5 kg (193 lb).  General appearance: alert, appears stated age and cooperative, does ambulate easily  Head:  Normocephalic, without obvious abnormality, atraumatic  Eyes: PERRL  Ears/mouth/throat:  Ears clear, mouth normal, throat no redness  Neck: no adenopathy, no JVD, supple, symmetrical, trachea midline and thyroid not enlarged  Lungs: clear to auscultation bilaterally  Heart: regular rate and rhythm  Abdomen: soft, non-tender; bowel sounds normal; no masses,  no organomegaly, port palpable RUQ  Extremities: extremities normal, atraumatic, no cyanosis or edema  Skin: no open wounds    Assessment:  Reflux from a slipped band despite being completely deflated.  Band still preventing her from eating too fast or too much  Carbonated drinks cause her to throw up, happens once a month.    Plan:  Having the band and port removed should improve the reflux.      Physician Signature: Electronically signed by Dr. Chanetta Marshall MD    Send copy to Vilinda Boehringer, APRN - CNP

## 2022-10-04 ENCOUNTER — Encounter: Admit: 2022-10-04 | Discharge: 2022-10-04 | Payer: MEDICARE | Attending: Internal Medicine | Primary: Family

## 2022-10-04 DIAGNOSIS — I5032 Chronic diastolic (congestive) heart failure: Secondary | ICD-10-CM

## 2022-10-04 NOTE — Progress Notes (Signed)
[facility] Cardiology progress note  Dr. Rolan Bucco      Reason for visit: Pedal edema  Referring Physician: Vilinda Boehringer, APRN - CNP     CHIEF COMPLAINT:   Chief Complaint   Patient presents with    Congestive Heart Failure     3 month       HISTORY OF PRESENT ILLNESS:   Patient is 72 years old female with history of diastolic congestive heart failure, hypertension, hypothyroidism, is here for follow-up appointment.  Patient denies any chest pain, no shortness of breath, no lightheadedness, no dizziness, no palpitations, no pedal edema, no PND, no orthopnea, no syncope, no presyncopal episodes.  Functional capacity is at baseline  Past Medical History:   Diagnosis Date    Bipolar 1 disorder (HCC)     Diverticular disease     Hypertension     Hypothyroidism     Spinal stenosis          Past Surgical History:   Procedure Laterality Date    APPENDECTOMY      BACK SURGERY      CHOLECYSTECTOMY      CHOLECYSTECTOMY      COLONOSCOPY      HYSTERECTOMY (CERVIX STATUS UNKNOWN)      KNEE SURGERY      torn meninscus    LAP BAND      LUMBAR LAMINECTOMY      OVARY REMOVAL      UPPER GASTROINTESTINAL ENDOSCOPY N/A 09/04/2022    ESOPHAGOGASTRODUODENOSCOPY BIOPSY performed by Arman Filter, MD at [facility] ENDOSCOPY         Current Outpatient Medications   Medication Sig Dispense Refill    dilTIAZem (CARDIZEM CD) 120 MG extended release capsule Take 1 capsule by mouth daily 90 capsule 1    omeprazole (PRILOSEC) 20 MG delayed release capsule Take 2 capsules by mouth daily      torsemide (DEMADEX) 20 MG tablet Take 1 tablet by mouth in the morning and at bedtime (Patient taking differently: Take 2 tablets by mouth daily) 180 tablet 3    valsartan (DIOVAN) 320 MG tablet Take 1 tablet by mouth daily 30 tablet 5    levothyroxine (SYNTHROID) 125 MCG tablet Take 2 tablets by mouth Daily      Cholecalciferol (VITAMIN D3) 1000 units CAPS Take 1 capsule by mouth daily      Multiple Vitamin (MULTIVITAMIN ADULT PO) Take 1 capsule by mouth daily       B-Complex TABS Take 1 tablet by mouth daily      vitamin B-6 (PYRIDOXINE) 100 MG tablet Take 1 tablet by mouth daily      buPROPion (WELLBUTRIN XL) 150 MG extended release tablet Take 1 tablet by mouth every morning      lamoTRIgine (LAMICTAL) 200 MG tablet Take 1 tablet by mouth daily      zinc sulfate (ZINCATE) 220 (50 Zn) MG capsule Take 1 capsule by mouth daily      ALPRAZolam (XANAX) 1 MG tablet Take 1 tablet by mouth daily as needed for Anxiety. 2 a day as needed       No current facility-administered medications for this visit.         Allergies as of 10/04/2022    (No Known Allergies)       Social History     Socioeconomic History    Marital status: Divorced     Spouse name: Not on file    Number of children: Not on file  Years of education: Not on file    Highest education level: Not on file   Occupational History    Not on file   Tobacco Use    Smoking status: Former     Types: Cigarettes    Smokeless tobacco: Never    Tobacco comments:     Smoked on and off.  Haven't smoked for about 20 years   Vaping Use    Vaping Use: Every day    Substances: THC   Substance and Sexual Activity    Alcohol use: Yes     Comment: wine daily    Drug use: Yes     Types: Marijuana Sheran Fava)     Comment: THC/medical marijuana card    Sexual activity: Not Currently     Partners: Male   Other Topics Concern    Not on file   Social History Narrative    Not on file     Social Determinants of Health     Financial Resource Strain: Not on file   Food Insecurity: Not on file   Transportation Needs: Not on file   Physical Activity: Inactive (04/12/2022)    Exercise Vital Sign     Days of Exercise per Week: 0 days     Minutes of Exercise per Session: 0 min   Stress: Not on file   Social Connections: Not on file   Intimate Partner Violence: Not on file   Housing Stability: Not on file       Family History   Problem Relation Age of Onset    Amyotrophic lateral sclerosis Mother     High Blood Pressure Father     Thyroid Disease Father      Gout Father     Stroke Father     Cancer Sister         throat, esophagus, tongue, vaginal    Hemochromatosis Sister     Leukemia Brother     Hypertension Daughter     Thyroid Disease Daughter     Hemochromatosis Son     Liver Disease Son     Gout Son        REVIEW OF SYSTEMS:     CONSTITUTIONAL:  negative for  fevers, chills, sweats and fatigue  HEENT:  negative for  tinnitus, earaches, nasal congestion and epistaxis  RESPIRATORY:  negative for  dry cough, cough with sputum,wheezing and hemoptysis  GASTROINTESTINAL:  negative for nausea, vomiting, diarrhea, constipation, pruritus and jaundice  HEMATOLOGIC/LYMPHATIC:  negative for easy bruising, bleeding, lymphadenopathy and petechiae  ENDOCRINE:  negative for heat intolerance, cold intolerance, tremor, hair loss and diabetic symptoms including neither polyuria nor polydipsia nor blurred vision  MUSCULOSKELETAL:  negative for  myalgias, arthralgias, joint swelling, stiff joints and decreased range of motion  NEUROLOGICAL:  negative for memory problems, speech problems, visual disturbance, dysphagia, weakness and numbness      PHYSICAL EXAM:   CONSTITUTIONAL:  awake, alert, cooperative, no apparent distress, and appears stated age  HEAD:  normocepalic, without obvious abnormality, atraumatic, pink, moist mucous membranes.  NECK:  Supple, symmetrical, trachea midline, no adenopathy, thyroid symmetric, not enlarged and no tenderness, skin normal  HEMATOLOGIC/LYMPHATICS:  no cervical lymphadenopathy and no supraclavicular lymphadenopathy  LUNGS:  No increased work of breathing, good air exchange, clear to auscultation bilaterally, no crackles or wheezing  CARDIOVASCULAR:  Normal apical impulse, regular rate and rhythm, normal S1 and S2, no S3 or S4, and no murmur noted and no JVD, no carotid bruit, trace pedal  edema, good carotid upstroke bilaterally.  ABDOMEN:  Soft, nontender, no masses, no hepatomegaly or splenomegaly, BS+  CHEST: nontender to palpation, expands  symmetrically  MUSCULOSKELETAL:  No clubbing no cyanosis.there is no redness, warmth, or swelling of the joints  full range of motion noted  NEUROLOGIC:  Alert, awake,oriented  SKIN:  no bruising or bleeding, normal skin color    BP 124/72   Pulse 68   Resp 16   Ht 1.575 m (5\' 2" )   Wt 87.5 kg (193 lb)   BMI 35.30 kg/m     DATA:   I personally reviewed the visit EKG with the following interpretation: Sinus rhythm, first-degree AV block, poor R wave progression, normal axis    EKG 06/28/2022, sinus rhythm, possible old anteroseptal wall MI age undetermined, no previous to compare to.    ECHO: 08/17/2022,    Left Ventricle: Normal left ventricular systolic function with a visually estimated EF of 55 - 60%. Left ventricle size is normal. Moderate posterior thickening. Normal wall motion. Grade II diastolic dysfunction with increased LAP.    Aortic Valve: Mild sclerosis of the aortic valve cusp.    Mitral Valve: Mild annular calcification of the mitral valve. Mild regurgitation.    Left Atrium: Left atrium is mildly dilated.    Image quality is good.  Next 01/31/2017,  - Left ventricle: The cavity size was normal. There was moderate     concentric hypertrophy. Systolic function was normal. The     estimated ejection fraction was in the range of 60% to 65%. Wall     motion was normal; there were no regional wall motion     abnormalities. Doppler parameters are consistent with abnormal     left ventricular relaxation (grade 1 diastolic dysfunction). The     E/e&' ratio is between 8-15, suggesting indeterminate LV filling     pressure.   - Mitral valve: Mildly thickened leaflets . There was trivial     regurgitation.   - Left atrium: The atrium was normal in size.   - Inferior vena cava: The vessel was normal in size. The     respirophasic diameter changes were in the normal range (= 50%),     consistent with normal central venous pressure.     Stress Test: 02/01/2017,  Overall Study Impression   Myocardial perfusion  is abnormal. This is a low risk study. Overall left ventricular systolic function was normal. Nuclear stress EF: 61%.     Angiography: Not performed to date  Cardiology Labs: BMP:    Lab Results   Component Value Date/Time    NA 134 09/05/2022 10:01 AM    K 4.4 09/05/2022 10:01 AM    CL 94 09/05/2022 10:01 AM    CO2 25 09/05/2022 10:01 AM    BUN 16 09/05/2022 10:01 AM    CREATININE 1.1 09/05/2022 10:01 AM     CMP:    Lab Results   Component Value Date/Time    NA 134 09/05/2022 10:01 AM    K 4.4 09/05/2022 10:01 AM    CL 94 09/05/2022 10:01 AM    CO2 25 09/05/2022 10:01 AM    BUN 16 09/05/2022 10:01 AM    CREATININE 1.1 09/05/2022 10:01 AM     CBC:    Lab Results   Component Value Date/Time    WBC 4.4 09/05/2022 10:01 AM    RBC 3.92 09/05/2022 10:01 AM    HGB 13.7 09/05/2022 10:01 AM    HCT 40.7  09/05/2022 10:01 AM    MCV 103.8 09/05/2022 10:01 AM    RDW 12.8 09/05/2022 10:01 AM    PLT 158 09/05/2022 10:01 AM     PT/INR:  No results found for: "PTINR"  PT/INR Warfarin:  No components found for: "PTPATWAR", "PTINRWAR"  PTT:  No results found for: "APTT"  PTT Heparin:  No components found for: "APTTHEP"  Magnesium:  No results found for: "MG"  TSH:    Lab Results   Component Value Date/Time    TSH 1.42 04/12/2022 01:30 PM     TROPONIN:  No components found for: "TROP"  BNP:  No results found for: "BNP"  FASTING LIPID PANEL:    Lab Results   Component Value Date/Time    CHOL 182 04/12/2022 01:30 PM    HDL 111 04/12/2022 01:30 PM    TRIG 112 04/12/2022 01:30 PM     No orders to display     I have personally reviewed the laboratory, cardiac diagnostic and radiographic testing as outlined above:  Old records from [facility] reviewed and summarized as above    IMPRESSION:  1.  Diastolic congestive heart failure: Compensated, continue current treatment  2.  Mitral valve regurgitation: Mild  3.  Hypertension: Controlled  4.  Hypothyroidism: On Synthroid supplement      RECOMMENDATIONS:   1.  Continue current  treatment  2.  CHF: Daily weight, take an extra torsemide for weight gain of more than 2-3 pounds in 24 hours, compliance with diuretics, low-salt diet were all advised.  3.  Follow-up with Vilinda Boehringer, APRN-CNP as scheduled  4.  Follow-up with Dr. Arnette Norris in 6 months, sooner if symptomatic for any reason    I have reviewed my findings and recommendations with patient    Electronically signed by Rolan Bucco, MD on 10/04/2022 at 3:32 PM    NOTE: This report was transcribed using voice recognition software. Every effort was made to ensure accuracy; however, inadvertent computerized transcription errors may be present

## 2022-10-11 ENCOUNTER — Inpatient Hospital Stay: Admit: 2022-10-11 | Discharge: 2022-10-11 | Payer: MEDICARE | Primary: Family

## 2022-10-11 DIAGNOSIS — D7589 Other specified diseases of blood and blood-forming organs: Secondary | ICD-10-CM

## 2022-10-11 DIAGNOSIS — D72819 Decreased white blood cell count, unspecified: Secondary | ICD-10-CM

## 2022-10-11 LAB — CBC WITH AUTO DIFFERENTIAL
Basophils %: 1 % (ref 0.0–2.0)
Basophils Absolute: 0.06 10*3/uL (ref 0.00–0.20)
Eosinophils %: 3 % (ref 0–6)
Eosinophils Absolute: 0.11 10*3/uL (ref 0.05–0.50)
Hematocrit: 37 % (ref 34.0–48.0)
Hemoglobin: 12.6 g/dL (ref 11.5–15.5)
Immature Granulocytes %: 1 % (ref 0.0–5.0)
Immature Granulocytes Absolute: <0.03 10*3/uL (ref 0.00–0.58)
Lymphocytes %: 54 % — ABNORMAL HIGH (ref 20.0–42.0)
Lymphocytes Absolute: 2.33 10*3/uL (ref 1.50–4.00)
MCH: 35.2 pg — ABNORMAL HIGH (ref 26.0–35.0)
MCHC: 34.1 g/dL (ref 32.0–34.5)
MCV: 103.4 fL — ABNORMAL HIGH (ref 80.0–99.9)
MPV: 9.8 fL (ref 7.0–12.0)
Monocytes %: 11 % (ref 2.0–12.0)
Monocytes Absolute: 0.49 10*3/uL (ref 0.10–0.95)
Neutrophils %: 30 % — ABNORMAL LOW (ref 43.0–80.0)
Neutrophils Absolute: 1.27 10*3/uL — ABNORMAL LOW (ref 1.80–7.30)
Platelets: 186 10*3/uL (ref 130–450)
RBC: 3.58 m/uL (ref 3.50–5.50)
RDW: 13 % (ref 11.5–15.0)
WBC: 4.3 10*3/uL — ABNORMAL LOW (ref 4.5–11.5)

## 2022-10-11 NOTE — Progress Notes (Signed)
Labs drawn peripherally and dry dressing applied. Specimen sent to lab.

## 2022-10-14 LAB — MISCELLANEOUS SENDOUT

## 2022-10-18 ENCOUNTER — Ambulatory Visit: Admit: 2022-10-18 | Discharge: 2022-10-18 | Payer: MEDICARE | Attending: Hematology & Oncology | Primary: Family

## 2022-10-18 DIAGNOSIS — D7589 Other specified diseases of blood and blood-forming organs: Secondary | ICD-10-CM

## 2022-10-18 NOTE — Progress Notes (Signed)
[facility] PHYSICIANS [facility] CARE [place]  [facility] MEDICAL ONCOLOGY  [address]  [place] [address]  Dept: [phone]  Loc: [phone]  Attending progress note      Reason for Visit:   Leukopenia and macrocytosis.    Referring Physician:  Vilinda Boehringer, APRN - CNP    PCP:  Vilinda Boehringer, APRN - CNP    History of Present Illness:      Margaret Zimmerman is a pleasant 72 year old lady, with a past medical history significant for bipolar disorder, diverticular disease, hypertension, hypothyroidism, and spinal stenosis who was referred to the hematology office for evaluation of leukopenia/neutropenia and macrocytosis.  The patient CBCD from 05/17/2022 was remarkable for a [name] count of 3.6, with an ANC of 1460, ALC 1730, no anemia or thrombocytopenia, MCV 104.4.  In February 2024 [name] count was normal, 5.3.  She had had macrocytosis since at least 2021.  The patient denies any unexplained weight loss, her weight does fluctuate by about 10 pounds, she does have night sweats, no recurrent infections.    Review of Systems;  CONSTITUTIONAL: No fever, chills.  Fair appetite.  ENMT: Eyes: No diplopia; Nose: No epistaxis. Mouth: No sore throat.  RESPIRATORY: No hemoptysis, shortness of breath, cough.   CARDIOVASCULAR: No chest pain, palpitations.  GASTROINTESTINAL: No nausea/vomiting, abdominal pain, diarrhea/constipation.  GENITOURINARY: No dysuria, urinary frequency, hematuria.  NEURO: No syncope, presyncope, headache.  Remainder:  ROS NEGATIVE    Past Medical History:      Diagnosis Date    Bipolar 1 disorder (HCC)     Diverticular disease     Hypertension     Hypothyroidism     Spinal stenosis      Patient Active Problem List   Diagnosis    Bipolar I disorder (HCC)    Chronic diastolic CHF (congestive heart failure) (HCC)    Essential hypertension    Anxiety    Arthritis    Diverticula of intestine    Complication of gastric banding        Past Surgical History:      Procedure Laterality Date     APPENDECTOMY      BACK SURGERY      CHOLECYSTECTOMY      CHOLECYSTECTOMY      COLONOSCOPY      HYSTERECTOMY (CERVIX STATUS UNKNOWN)      KNEE SURGERY      torn meninscus    LAP BAND      LUMBAR LAMINECTOMY      OVARY REMOVAL      UPPER GASTROINTESTINAL ENDOSCOPY N/A 09/04/2022    ESOPHAGOGASTRODUODENOSCOPY BIOPSY performed by Arman Filter, MD at [facility] ENDOSCOPY       Family History:  Family History   Problem Relation Age of Onset    Amyotrophic lateral sclerosis Mother     High Blood Pressure Father     Thyroid Disease Father     Gout Father     Stroke Father     Cancer Sister         throat, esophagus, tongue, vaginal    Hemochromatosis Sister     Leukemia Brother     Hypertension Daughter     Thyroid Disease Daughter     Hemochromatosis Son     Liver Disease Son     Gout Son        Medications:  Reviewed and reconciled.    Social History:  Social History     Socioeconomic History    Marital  status: Divorced     Spouse name: Not on file    Number of children: Not on file    Years of education: Not on file    Highest education level: Not on file   Occupational History    Not on file   Tobacco Use    Smoking status: Former     Types: Cigarettes    Smokeless tobacco: Never    Tobacco comments:     Smoked on and off.  Haven't smoked for about 20 years   Vaping Use    Vaping status: Every Day    Substances: THC   Substance and Sexual Activity    Alcohol use: Yes     Comment: wine daily    Drug use: Yes     Types: Marijuana Sheran Fava)     Comment: THC/medical marijuana card    Sexual activity: Not Currently     Partners: Male   Other Topics Concern    Not on file   Social History Narrative    Not on file     Social Determinants of Health     Financial Resource Strain: Not on file   Food Insecurity: Not on file   Transportation Needs: Not on file   Physical Activity: Inactive (04/12/2022)    Exercise Vital Sign     Days of Exercise per Week: 0 days     Minutes of Exercise per Session: 0 min   Stress: Not on file   Social  Connections: Not on file   Intimate Partner Violence: Not on file   Housing Stability: Not on file       Allergies:  No Known Allergies    Physical Exam:  BP 123/63   Pulse 77   Temp 97.7 F (36.5 C)   Ht 1.575 m (5\' 2" )   Wt 90.4 kg (199 lb 4.8 oz)   SpO2 100%   BMI 36.45 kg/m   GENERAL: Alert, oriented x 3, not in acute distress.  HEENT: PERRLA; EOMI. Oropharynx clear.   NECK: Supple. No palpable cervical or supraclavicular lymphadenopathy.   LUNGS: Good air entry bilaterally. No wheezing, crackles or rhonchi.   CARDIOVASCULAR: Regular rate. No murmurs, rubs or gallops.   ABDOMEN: Soft. Non-tender, non-distended. Positive bowel sounds.  EXTREMITIES: Without clubbing, cyanosis, or edema.   NEUROLOGIC: No focal deficits.   ECOG PS 0        Impression/Plan:      Margaret Zimmerman is a pleasant 72 year old lady, with a past medical history significant for bipolar disorder, diverticular disease, hypertension, hypothyroidism, and spinal stenosis who was referred to the hematology office for evaluation of leukopenia/neutropenia and macrocytosis.  The patient CBCD from 05/17/2022 was remarkable for a [name] count of 3.6, with an ANC of 1460, ALC 1730, no anemia or thrombocytopenia, MCV 104.4.  In February 2024 [name] count was normal, 5.3.  She had had macrocytosis since at least 2021.  From 05/17/2022 ALT 30, AST 40.    The patient has leukopenia and neutropenia, of new onset, could be reactive, secondary to inflammation, possibly an infection, the patient has transaminitis, she did have an ultrasound of the abdomen done on 06/13/2022, which I had reviewed, revealing normal liver echogenicity.  Vitamin B12 716, folate greater than 20.  Peripheral smear had reviewed by neutropenia and macrocytosis recommended a workup to evaluate for an autoimmune disease, chronic viral infections, on the differential is a primary bone marrow disorder.  The patient has macrocytosis without anemia, could be secondary  to alcohol, discussed with  the patient the importance of abstinence from alcohol.      The workup results were reviewed with the patient, ANA is negative, HIV screen is negative, viral hepatitis panel is also negative, SPEP is negative for monoclonal proteins, CBCD had revealed a Bressi count of 3.3, ANC is stable 1370, ALC 1440, normal hemoglobin, MCV had improved to 101.3, hemoglobin 13.9, flow cytometry had revealed relative neutropenia with activated immunophenotype, which could be secondary to exogenous factors/insults, infection, inflammation, medication, immune mediated destruction, or in the setting of a systemic autoimmune disease, potentially due to underlying bone marrow based myeloid disorder.  The results were reviewed with the patient, we discussed the option of having the bone marrow biopsy and aspirate done to exclude a primary bone marrow disorder, or to have a follow-up in 2 months with blood counts and repeat flow stomach tree as she has been having thyroid issues, we decided to have the blood work done in 2 months, bone marrow biopsy and aspirate if persistent changes.  Labs reviewed with the patient, from 10/11/2022, Frentz count was stable 4.3, ANC 1270, ALC 2330, hemoglobin 12.6, hematocrit 37, MCV 103.4, platelet count 186K, close telemetry was done and is negative for blast, B/T-cell neoplasm, no immunophenotypic abnormalities.  Recommended to monitor her counts, will have CBCD done in 3 months, bone marrow biopsy and aspirate if any changes.    RTC in 3 months.    Thank you for allowing [place] to participate in the care of Margaret Zimmerman.    Celesta Aver, MD   HEMATOLOGY/MEDICAL ONCOLOGY  [facility] PHYSICIANS [place] CARE [facility]  [facility] MEDICAL ONCOLOGY  [address]  [place] [address]  Dept: [phone]  Loc: [phone]

## 2022-11-13 ENCOUNTER — Ambulatory Visit: Admit: 2022-11-13 | Discharge: 2022-11-13 | Payer: MEDICARE | Attending: Family | Primary: Family

## 2022-11-13 VITALS — BP 138/68 | HR 79 | Temp 97.30000°F | Resp 18 | Ht 62.0 in | Wt 195.0 lb

## 2022-11-13 DIAGNOSIS — I10 Essential (primary) hypertension: Secondary | ICD-10-CM

## 2022-11-13 NOTE — Progress Notes (Signed)
 Chief Complaint   Patient presents with    Follow-up    Gastroesophageal Reflux    Anxiety    Depression    Hypothyroidism    Hypertension    Congestive Heart Failure    Obesity    Leukopenia/Neutropenia/Macrocytosis       HPI  Acid Reflux  Lap band had slipped and that was causing acid reflux    09/04/2022 11:22 AM ESOPHAGOGASTRODUODENOSCOPY BIOPSY        POSTOPERATIVE DIAGNOSES:       EGD 09/04/2022 showed mild esophagitis, 1 cm hiatal hernia, 2 cm band slip but no band erosion, she does complain of acid reflux, biopsy done to check for H.pylori     Heliobacter Pylori ID  NEGATIVE NEGATIVE     Anxiety/Depression  Currently on Xanax, Lamictal and Wellbutrin    Hypothyroidism  Currently on Synthroid        Component  Ref Range & Units 04/12/22 1330   TSH  0.27 - 4.20 uIU/mL 1.42     Component  Ref Range & Units 04/12/22 1330   T4 Free  0.9 - 1.7 ng/dL 1.4     Hypertension  Currently on valsartan  and Cardizem   BP Readings from Last 3 Encounters:   11/13/22 138/68   10/18/22 123/63   10/04/22  124/72      DHF  Currently on Bumex , Cardizem  and Losartan.  She follow with Dr. Lanie.     Echo 08/21/22    Left Ventricle: Normal left ventricular systolic function with a visually estimated EF of 55 - 60%. Left ventricle size is normal. Moderate posterior thickening. Normal wall motion. Grade II diastolic dysfunction with increased LAP.    Aortic Valve: Mild sclerosis of the aortic valve cusp.    Mitral Valve: Mild annular calcification of the mitral valve. Mild regurgitation.    Left Atrium: Left atrium is mildly dilated.    Image quality is good.    Obesity  She does not want her lap band removes as she does not want to gain weight.   Body mass index is 35.67 kg/m.      11/13/2022     5:31 PM 10/18/2022     8:56 AM 10/04/2022     3:04 PM 09/20/2022     8:09 AM 09/04/2022    10:24 AM 08/23/2022     8:24 AM 08/17/2022     6:55 AM   Weight Metrics   Weight 195 lb 199 lb 4.8 oz 193 lb 193 lb 192 lb 192 lb 193 lb   BMI (Calculated) 35.7 kg/m2 36.5 kg/m2 35.4 kg/m2 35.4 kg/m2 35.2 kg/m2 35.2 kg/m2 35.4 kg/m2     Weight  Height: 157.5 cm (5' 2)  Weight - Scale: 88.5 kg (195 lb)  BMI (Calculated): 35.7    Leukopenia/Neutropenia/Macrocytosis  She follows with Dr. Margart Amil.  She does not want a bone marrow biopsy.    Health Maintenance  She declines colon cancer screening.     Patient Active Problem List   Diagnosis    Bipolar I disorder (HCC)    Chronic diastolic CHF (congestive heart failure) (HCC)    Essential hypertension    Anxiety    Arthritis    Diverticula of intestine    Complication of gastric banding       Past Medical History:   Diagnosis Date    Bipolar 1 disorder (HCC)     Diverticular disease     Hypertension     Hypothyroidism     Spinal stenosis        Past Surgical History:   Procedure Laterality Date    APPENDECTOMY      BACK SURGERY      CHOLECYSTECTOMY      CHOLECYSTECTOMY      COLONOSCOPY      COLONOSCOPY  HYSTERECTOMY (CERVIX STATUS UNKNOWN)      KNEE SURGERY      torn meninscus    LAP BAND       LUMBAR LAMINECTOMY      OVARY REMOVAL      UPPER GASTROINTESTINAL ENDOSCOPY N/A 09/04/2022    ESOPHAGOGASTRODUODENOSCOPY BIOPSY performed by Eliza Charleston, MD at [facility] ENDOSCOPY       Current Outpatient Medications   Medication Sig Dispense Refill    bumetanide  (BUMEX ) 1 MG tablet Take 1 tablet by mouth daily 30 tablet 1    valsartan  (DIOVAN ) 320 MG tablet Take 1 tablet by mouth daily 30 tablet 5    levothyroxine  (SYNTHROID ) 125 MCG tablet Take 2 tablets by mouth Daily      Cholecalciferol (VITAMIN D3) 1000 units CAPS Take 1 capsule by mouth daily      Multiple Vitamin (MULTIVITAMIN ADULT PO) Take 1 capsule by mouth daily      B-Complex TABS Take 1 tablet by mouth daily      vitamin B-6 (PYRIDOXINE) 100 MG tablet Take 1 tablet by mouth daily      buPROPion (WELLBUTRIN XL) 150 MG extended release tablet Take 1 tablet by mouth every morning      lamoTRIgine (LAMICTAL) 200 MG tablet Take 1 tablet by mouth daily      zinc  sulfate (ZINCATE) 220 (50 Zn) MG capsule Take 1 capsule by mouth daily      ALPRAZolam (XANAX) 1 MG tablet Take 1 tablet by mouth daily as needed for Anxiety. 2 a day as needed      dilTIAZem  (CARDIZEM  CD) 120 MG extended release capsule TAKE 1 CAPSULE BY MOUTH DAILY 90 capsule 1    pantoprazole  (PROTONIX ) 40 MG tablet TAKE 1 TABLET BY MOUTH EVERY MORNING BEFORE BREAKFAST 90 tablet 1     No current facility-administered medications for this visit.       No Known Allergies    Social History     Socioeconomic History    Marital status: Divorced     Spouse name: None    Number of children: None    Years of education: None    Highest education level: None   Tobacco Use    Smoking status: Former     Types: Cigarettes    Smokeless tobacco: Never    Tobacco comments:     Smoked on and off.  Haven't smoked for about 20 years   Vaping Use    Vaping status: Every Day    Substances: THC   Substance and Sexual Activity    Alcohol use: Yes     Comment: wine daily    Drug use: Yes     Types: Marijuana Oda)      Comment: THC/medical marijuana card    Sexual activity: Not Currently     Partners: Male     Social Determinants of Health     Physical Activity: Inactive (04/12/2022)    Exercise Vital Sign     Days of Exercise per Week: 0 days     Minutes of Exercise per Session: 0 min       Family History   Problem Relation Age of Onset    Amyotrophic lateral sclerosis Mother     High Blood Pressure Father     Thyroid Disease Father     Gout Father     Stroke Father     Cancer Sister         throat, esophagus, tongue, vaginal  Hemochromatosis Sister     Leukemia Brother     Hypertension Daughter     Thyroid Disease Daughter     Hemochromatosis Son     Liver Disease Son     Gout Son           Review of Systems   Constitutional:  Negative for activity change and fatigue.   HENT:  Negative for congestion, postnasal drip and sore throat.    Eyes:  Negative for redness and visual disturbance.   Respiratory:  Negative for shortness of breath and wheezing.    Cardiovascular:  Negative for chest pain and leg swelling.   Gastrointestinal:  Negative for abdominal pain, constipation and diarrhea.   Genitourinary:  Negative for dysuria, flank pain and frequency.   Musculoskeletal:  Negative for arthralgias, back pain and myalgias.   Skin:  Negative for color change and rash.   Neurological:  Negative for dizziness and headaches.   Psychiatric/Behavioral:  Negative for sleep disturbance. The patient is not nervous/anxious.         Anxiety       BP 138/68 (Site: Right Upper Arm, Position: Sitting, Cuff Size: Medium Adult)   Pulse 79   Temp 97.3 F (36.3 C) (Temporal)   Resp 18   Ht 1.575 m (5' 2)   Wt 88.5 kg (195 lb)   SpO2 97%   BMI 35.67 kg/m     BP Readings from Last 3 Encounters:   11/13/22 138/68   10/18/22 123/63   10/04/22 124/72       Wt Readings from Last 3 Encounters:   11/13/22 88.5 kg (195 lb)   10/18/22 90.4 kg (199 lb 4.8 oz)   10/04/22 87.5 kg (193 lb)       Physical Exam  Constitutional:       General: She is awake.       Appearance: Normal appearance.   HENT:      Head: Normocephalic and atraumatic.      Right Ear: Tympanic membrane, ear canal and external ear normal.      Left Ear: Tympanic membrane, ear canal and external ear normal.      Nose: Nose normal.      Mouth/Throat:      Mouth: Mucous membranes are moist.   Eyes:      General: Lids are normal.      Extraocular Movements: Extraocular movements intact.      Conjunctiva/sclera: Conjunctivae normal.      Pupils: Pupils are equal, round, and reactive to light.   Neck:      Trachea: Phonation normal.   Cardiovascular:      Rate and Rhythm: Normal rate and regular rhythm.      Pulses: Normal pulses.      Heart sounds: Normal heart sounds.   Pulmonary:      Effort: Pulmonary effort is normal.      Breath sounds: Normal breath sounds.   Abdominal:      General: Bowel sounds are normal.      Palpations: Abdomen is soft.   Musculoskeletal:         General: Normal range of motion.      Cervical back: Normal range of motion and neck supple.      Right lower leg: No edema.      Left lower leg: No edema.   Feet:      Right foot:      Skin integrity: Skin integrity normal.  Left foot:      Skin integrity: Skin integrity normal.   Skin:     General: Skin is warm and dry.      Capillary Refill: Capillary refill takes less than 2 seconds.   Neurological:      General: No focal deficit present.      Mental Status: She is alert and oriented to person, place, and time.   Psychiatric:         Attention and Perception: Attention and perception normal.         Mood and Affect: Mood normal.         Speech: Speech normal.         Behavior: Behavior normal. Behavior is cooperative.         Cognition and Memory: Cognition normal.         CBC with Differential:    Lab Results   Component Value Date/Time    WBC 4.3 10/11/2022 03:30 PM    RBC 3.58 10/11/2022 03:30 PM    HGB 12.6 10/11/2022 03:30 PM    HCT 37.0 10/11/2022 03:30 PM    PLT 186 10/11/2022 03:30 PM    MCV 103.4 10/11/2022 03:30 PM     MCH 35.2 10/11/2022 03:30 PM    MCHC 34.1 10/11/2022 03:30 PM    RDW 13.0 10/11/2022 03:30 PM    LYMPHOPCT 54 10/11/2022 03:30 PM    MONOPCT 11 10/11/2022 03:30 PM    EOSPCT 3 10/11/2022 03:30 PM    BASOPCT 1 10/11/2022 03:30 PM    MONOSABS 0.49 10/11/2022 03:30 PM    LYMPHSABS 2.33 10/11/2022 03:30 PM    EOSABS 0.11 10/11/2022 03:30 PM    BASOSABS 0.06 10/11/2022 03:30 PM     CMP:    Lab Results   Component Value Date/Time    NA 134 09/05/2022 10:01 AM    K 4.4 09/05/2022 10:01 AM    CL 94 09/05/2022 10:01 AM    CO2 25 09/05/2022 10:01 AM    BUN 16 09/05/2022 10:01 AM    CREATININE 1.1 09/05/2022 10:01 AM    GFRAA >60 12/09/2019 03:50 AM    LABGLOM 57 09/05/2022 10:01 AM    LABGLOM 61 06/08/2022 12:05 PM    GLUCOSE 91 09/05/2022 10:01 AM    CALCIUM 9.3 09/05/2022 10:01 AM    BILITOT 0.6 09/05/2022 10:01 AM    ALKPHOS 141 09/05/2022 10:01 AM    AST 56 09/05/2022 10:01 AM    ALT 32 09/05/2022 10:01 AM        Patient Active Problem List   Diagnosis    Bipolar I disorder (HCC)    Chronic diastolic CHF (congestive heart failure) (HCC)    Essential hypertension    Anxiety    Arthritis    Diverticula of intestine    Complication of gastric banding       ASSESSMENT/PLAN:    1. Primary hypertension    2. Chronic heart failure with preserved ejection fraction (HFpEF) (HCC)    3. Hypothyroidism, unspecified type  - T4, Free; Future  - TSH; Future    4. Elevated liver enzymes  - Comprehensive Metabolic Panel; Future    5. Dyslipidemia  - Lipid Panel; Future    6. Bipolar I disorder (HCC)    7. Macrocytosis  - CBC with Auto Differential; Future  - Vitamin B12 & Folate; Future    8. Leukopenia, unspecified type  - CBC with Auto Differential; Future    9. IFG (impaired  fasting glucose)  - Hemoglobin A1C; Future    10. Vitamin D deficiency  - Vitamin D 25 Hydroxy; Future    Follow up in 3 months.    Rosaline Dasen, APRN - CNP,   11/18/22  3:08 PM EDT

## 2022-11-14 MED ORDER — PANTOPRAZOLE SODIUM 40 MG PO TBEC
40 | ORAL_TABLET | Freq: Every day | ORAL | 1 refills | Status: AC
Start: 2022-11-14 — End: ?

## 2022-11-14 MED ORDER — PANTOPRAZOLE SODIUM 40 MG PO TBEC
40 | ORAL_TABLET | Freq: Every day | ORAL | 1 refills | Status: DC
Start: 2022-11-14 — End: 2022-11-14

## 2022-11-14 MED ORDER — BUMETANIDE 1 MG PO TABS
1 | ORAL_TABLET | Freq: Every day | ORAL | 1 refills | Status: DC
Start: 2022-11-14 — End: 2022-11-22

## 2022-11-15 ENCOUNTER — Encounter

## 2022-11-15 MED ORDER — DILTIAZEM HCL ER COATED BEADS 120 MG PO CP24
120 | ORAL_CAPSULE | Freq: Every day | ORAL | 1 refills | Status: DC
Start: 2022-11-15 — End: 2022-12-21

## 2022-11-20 ENCOUNTER — Encounter

## 2022-11-20 MED ORDER — METFORMIN HCL 500 MG PO TABS
500 | ORAL_TABLET | Freq: Every day | ORAL | 1 refills | Status: DC
Start: 2022-11-20 — End: 2022-12-19

## 2022-11-20 MED ORDER — METFORMIN HCL 500 MG PO TABS
500 | ORAL_TABLET | Freq: Every day | ORAL | 0 refills | Status: DC
Start: 2022-11-20 — End: 2022-11-20

## 2022-11-22 ENCOUNTER — Encounter

## 2022-11-23 MED ORDER — TORSEMIDE 20 MG PO TABS
20 | ORAL_TABLET | Freq: Every day | ORAL | 1 refills | Status: AC
Start: 2022-11-23 — End: ?

## 2022-12-17 MED ORDER — VALSARTAN 320 MG PO TABS
320 MG | ORAL_TABLET | Freq: Every day | ORAL | 1 refills | Status: AC
Start: 2022-12-17 — End: 2023-02-24

## 2022-12-19 ENCOUNTER — Encounter

## 2022-12-20 MED ORDER — METFORMIN HCL 500 MG PO TABS
500 | ORAL_TABLET | Freq: Two times a day (BID) | ORAL | 1 refills | Status: AC
Start: 2022-12-20 — End: ?

## 2022-12-21 ENCOUNTER — Telehealth: Admit: 2022-12-21 | Discharge: 2022-12-21 | Payer: MEDICARE | Attending: Family | Primary: Family

## 2022-12-21 ENCOUNTER — Encounter

## 2022-12-21 DIAGNOSIS — G5601 Carpal tunnel syndrome, right upper limb: Secondary | ICD-10-CM

## 2022-12-21 MED ORDER — ATENOLOL 25 MG PO TABS
25 | ORAL_TABLET | Freq: Every day | ORAL | 3 refills | Status: DC
Start: 2022-12-21 — End: 2022-12-21

## 2022-12-21 MED ORDER — ATENOLOL 25 MG PO TABS
25 MG | ORAL_TABLET | Freq: Every day | ORAL | 0 refills | Status: DC
Start: 2022-12-21 — End: 2022-12-31

## 2022-12-21 NOTE — Progress Notes (Unsigned)
No chief complaint on file.        An electronic signature was used to authenticate this note. Margaret Zimmerman, was evaluated through a synchronous (real-time) audio-video encounter. The patient (or guardian if applicable) is aware that this is a billable service, which includes applicable co-pays. This Virtual Visit was conducted with patient's (and/or legal guardian's) consent. Patient identification was verified, and a caregiver was present when appropriate.   The patient was located at Home: [address]  [place] [address]  Provider was located at Facility (Appt Dept): [address] [place],  [place] [address]  Confirm you are appropriately licensed, registered, or certified to deliver care in the state where the patient is located as indicated above. If you are not or unsure, please re-schedule the visit: Yes, I confirm.      Total time spent for this encounter: Not billed by time    Patient does agree to proceed with telemedicine consultation.    --Vilinda Boehringer, APRN - CNP on 12/21/2022 at 9:58 AM    An electronic signature was used to authenticate this note.      HPI      Right upper extremity pain and parasthesias  She has pain in the right upper arm/hand/wrist.  Her right thumb index finger thumb goes numb.  Will refer to Ortho for carpal tunnel evaluation.     Generalized joint pain  She would like checked for rheumatoid arthritis.  WIll inflammatory marker and Lupus.       Family history:  Hemachromatosis - sister, vasculitis  Brother- Leukemia  Grandmother - pokycythemia      Patient Active Problem List   Diagnosis    Bipolar I disorder (HCC)    Chronic diastolic CHF (congestive heart failure) (HCC)    Essential hypertension    Anxiety    Arthritis    Diverticula of intestine    Complication of gastric banding       Past Medical History:   Diagnosis Date    Bipolar 1 disorder (HCC)     Diverticular disease     Hypertension     Hypothyroidism     Spinal stenosis        Past Surgical History:    Procedure Laterality Date    APPENDECTOMY      BACK SURGERY      CHOLECYSTECTOMY      CHOLECYSTECTOMY      COLONOSCOPY      COLONOSCOPY      HYSTERECTOMY (CERVIX STATUS UNKNOWN)      KNEE SURGERY      torn meninscus    LAP BAND      LUMBAR LAMINECTOMY      OVARY REMOVAL      UPPER GASTROINTESTINAL ENDOSCOPY N/A 09/04/2022    ESOPHAGOGASTRODUODENOSCOPY BIOPSY performed by Arman Filter, MD at [facility] ENDOSCOPY       Current Outpatient Medications   Medication Sig Dispense Refill    metFORMIN (GLUCOPHAGE) 500 MG tablet Take 1 tablet by mouth 2 times daily (with meals) 180 tablet 1    valsartan (DIOVAN) 320 MG tablet TAKE 1 TABLET BY MOUTH DAILY 90 tablet 1    torsemide (DEMADEX) 20 MG tablet Take 1 tablet by mouth daily 90 tablet 1    dilTIAZem (CARDIZEM CD) 120 MG extended release capsule TAKE 1 CAPSULE BY MOUTH DAILY 90 capsule 1    pantoprazole (PROTONIX) 40 MG tablet TAKE 1 TABLET BY MOUTH EVERY MORNING BEFORE BREAKFAST 90 tablet 1  levothyroxine (SYNTHROID) 125 MCG tablet Take 2 tablets by mouth Daily      Cholecalciferol (VITAMIN D3) 1000 units CAPS Take 1 capsule by mouth daily      Multiple Vitamin (MULTIVITAMIN ADULT PO) Take 1 capsule by mouth daily      B-Complex TABS Take 1 tablet by mouth daily      buPROPion (WELLBUTRIN XL) 150 MG extended release tablet Take 1 tablet by mouth every morning      lamoTRIgine (LAMICTAL) 200 MG tablet Take 1 tablet by mouth daily      zinc sulfate (ZINCATE) 220 (50 Zn) MG capsule Take 1 capsule by mouth daily      ALPRAZolam (XANAX) 1 MG tablet Take 1 tablet by mouth daily as needed for Anxiety. 2 a day as needed       No current facility-administered medications for this visit.       No Known Allergies    Social History     Socioeconomic History    Marital status: Divorced     Spouse name: None    Number of children: None    Years of education: None    Highest education level: None   Tobacco Use    Smoking status: Former     Types: Cigarettes    Smokeless tobacco:  Never    Tobacco comments:     Smoked on and off.  Haven't smoked for about 20 years   Vaping Use    Vaping status: Every Day    Substances: THC   Substance and Sexual Activity    Alcohol use: Yes     Comment: wine daily    Drug use: Yes     Types: Marijuana Sheran Fava)     Comment: THC/medical marijuana card    Sexual activity: Not Currently     Partners: Male     Social Determinants of Health     Physical Activity: Inactive (04/12/2022)    Exercise Vital Sign     Days of Exercise per Week: 0 days     Minutes of Exercise per Session: 0 min       Family History   Problem Relation Age of Onset    Amyotrophic lateral sclerosis Mother     High Blood Pressure Father     Thyroid Disease Father     Gout Father     Stroke Father     Cancer Sister         throat, esophagus, tongue, vaginal    Hemochromatosis Sister     Leukemia Brother     Hypertension Daughter     Thyroid Disease Daughter     Hemochromatosis Son     Liver Disease Son     Gout Son           Review of Systems   Musculoskeletal:  Positive for arthralgias and myalgias.   Neurological:  Positive for numbness.       There were no vitals taken for this visit. - Virtual exam    BP Readings from Last 3 Encounters:   11/13/22 138/68   10/18/22 123/63   10/04/22 124/72       Wt Readings from Last 3 Encounters:   11/13/22 88.5 kg (195 lb)   10/18/22 90.4 kg (199 lb 4.8 oz)   10/04/22 87.5 kg (193 lb)       Physical Exam - Virtual visit    CBC with Differential:    Lab Results   Component Value Date/Time  WBC 4.3 10/11/2022 03:30 PM    RBC 3.58 10/11/2022 03:30 PM    HGB 12.6 10/11/2022 03:30 PM    HCT 37.0 10/11/2022 03:30 PM    PLT 186 10/11/2022 03:30 PM    MCV 103.4 10/11/2022 03:30 PM    MCH 35.2 10/11/2022 03:30 PM    MCHC 34.1 10/11/2022 03:30 PM    RDW 13.0 10/11/2022 03:30 PM    LYMPHOPCT 54 10/11/2022 03:30 PM    MONOPCT 11 10/11/2022 03:30 PM    EOSPCT 3 10/11/2022 03:30 PM    BASOPCT 1 10/11/2022 03:30 PM    MONOSABS 0.49 10/11/2022 03:30 PM    LYMPHSABS 2.33  10/11/2022 03:30 PM    EOSABS 0.11 10/11/2022 03:30 PM    BASOSABS 0.06 10/11/2022 03:30 PM     CMP:    Lab Results   Component Value Date/Time    NA 134 09/05/2022 10:01 AM    K 4.4 09/05/2022 10:01 AM    CL 94 09/05/2022 10:01 AM    CO2 25 09/05/2022 10:01 AM    BUN 16 09/05/2022 10:01 AM    CREATININE 1.1 09/05/2022 10:01 AM    GFRAA >60 12/09/2019 03:50 AM    LABGLOM 57 09/05/2022 10:01 AM    LABGLOM 61 06/08/2022 12:05 PM    GLUCOSE 91 09/05/2022 10:01 AM    CALCIUM 9.3 09/05/2022 10:01 AM    BILITOT 0.6 09/05/2022 10:01 AM    ALKPHOS 141 09/05/2022 10:01 AM    AST 56 09/05/2022 10:01 AM    ALT 32 09/05/2022 10:01 AM        Patient Active Problem List   Diagnosis    Bipolar I disorder (HCC)    Chronic diastolic CHF (congestive heart failure) (HCC)    Essential hypertension    Anxiety    Arthritis    Diverticula of intestine    Complication of gastric banding       ASSESSMENT/PLAN:    There are no diagnoses linked to this encounter.        Vilinda Boehringer, APRN - CNP  12/21/22  8:06 AM EDT

## 2022-12-24 ENCOUNTER — Encounter

## 2022-12-24 LAB — CBC WITH AUTO DIFFERENTIAL
Basophils %: 2 % (ref 0.0–2.0)
Basophils Absolute: 0.08 10*3/uL (ref 0.00–0.20)
Eosinophils %: 3 % (ref 0–6)
Eosinophils Absolute: 0.1 10*3/uL (ref 0.05–0.50)
Hematocrit: 40.2 % (ref 34.0–48.0)
Hemoglobin: 12.9 g/dL (ref 11.5–15.5)
Immature Granulocytes %: 0 % (ref 0.0–5.0)
Immature Granulocytes Absolute: <0.03 10*3/uL (ref 0.00–0.58)
Lymphocytes %: 42 % (ref 20.0–42.0)
Lymphocytes Absolute: 1.58 10*3/uL (ref 1.50–4.00)
MCH: 35.3 pg — ABNORMAL HIGH (ref 26.0–35.0)
MCHC: 32.1 g/dL (ref 32.0–34.5)
MCV: 110.1 fL — ABNORMAL HIGH (ref 80.0–99.9)
MPV: 9.9 fL (ref 7.0–12.0)
Monocytes %: 10 % (ref 2.0–12.0)
Monocytes Absolute: 0.39 10*3/uL (ref 0.10–0.95)
Neutrophils %: 43 % (ref 43.0–80.0)
Neutrophils Absolute: 1.62 10*3/uL — ABNORMAL LOW (ref 1.80–7.30)
Platelets: 167 10*3/uL (ref 130–450)
RBC: 3.65 m/uL (ref 3.50–5.50)
RDW: 12.8 % (ref 11.5–15.0)
WBC: 3.8 10*3/uL — ABNORMAL LOW (ref 4.5–11.5)

## 2022-12-24 LAB — VITAMIN B12 & FOLATE
Folate: >20 ng/mL (ref 4.8–24.2)
Vitamin B-12: 568 pg/mL (ref 211–946)

## 2022-12-24 LAB — HEMOGLOBIN A1C: Hemoglobin A1C: 5.1 % (ref 4.0–5.6)

## 2022-12-24 LAB — T4, FREE: T4 Free: 2.4 ng/dL — ABNORMAL HIGH (ref 0.9–1.7)

## 2022-12-24 LAB — SEDIMENTATION RATE: Sed Rate, Automated: 7 mm/h (ref 0–20)

## 2022-12-24 LAB — TSH: TSH: 0.16 u[IU]/mL — ABNORMAL LOW (ref 0.27–4.20)

## 2022-12-25 ENCOUNTER — Encounter

## 2022-12-25 LAB — LIPID PANEL
Cholesterol, Total: 202 mg/dL — ABNORMAL HIGH (ref <200)
HDL: 126 mg/dL (ref >40)
LDL Cholesterol: 56 mg/dL (ref <100)
Triglycerides: 99 mg/dL (ref <150)
VLDL: 20 mg/dL

## 2022-12-25 LAB — COMPREHENSIVE METABOLIC PANEL
ALT: 25 U/L (ref 0–32)
AST: 37 U/L — ABNORMAL HIGH (ref 0–31)
Albumin: 4.5 g/dL (ref 3.5–5.2)
Alkaline Phosphatase: 142 U/L — ABNORMAL HIGH (ref 35–104)
Anion Gap: 15 mmol/L (ref 7–16)
BUN: 18 mg/dL (ref 6–23)
CO2: 25 mmol/L (ref 22–29)
Calcium: 9.8 mg/dL (ref 8.6–10.2)
Chloride: 102 mmol/L (ref 98–107)
Creatinine: 1.2 mg/dL — ABNORMAL HIGH (ref 0.50–1.00)
Est, Glom Filt Rate: 48 mL/min/{1.73_m2} — ABNORMAL LOW (ref >60)
Glucose: 83 mg/dL (ref 74–99)
Potassium: 4.7 mmol/L (ref 3.5–5.0)
Sodium: 142 mmol/L (ref 132–146)
Total Bilirubin: 0.3 mg/dL (ref 0.0–1.2)
Total Protein: 6.8 g/dL (ref 6.4–8.3)

## 2022-12-25 LAB — TSH: TSH: 0.16 u[IU]/mL — ABNORMAL LOW (ref 0.27–4.20)

## 2022-12-25 LAB — C-REACTIVE PROTEIN: CRP: <3 mg/L (ref 0–5)

## 2022-12-25 LAB — T4, FREE: T4 Free: 2.4 ng/dL — ABNORMAL HIGH (ref 0.9–1.7)

## 2022-12-25 LAB — VITAMIN D 25 HYDROXY: Vit D, 25-Hydroxy: 75.5 ng/mL (ref 30.0–100.0)

## 2022-12-25 LAB — RHEUMATOID FACTOR: Rheumatoid Factor: 12 [IU]/mL (ref 0–13)

## 2022-12-26 MED ORDER — LEVOTHYROXINE SODIUM 100 MCG PO TABS
100 | ORAL_TABLET | Freq: Every day | ORAL | 1 refills | Status: AC
Start: 2022-12-26 — End: ?

## 2022-12-27 LAB — LUPUS (LE) PANEL W/ REFLEX
Antinuclear Antibody, Hep-2, IGG: NOT DETECTED
Complement Component 3A: 131 mg/dL (ref 90–180)
Complement Component 4: 39 mg/dL (ref 10–40)
Rheumatoid Factor: 11 [IU]/mL (ref 0–14)

## 2022-12-31 ENCOUNTER — Encounter

## 2022-12-31 MED ORDER — ATENOLOL 25 MG PO TABS
25 MG | ORAL_TABLET | Freq: Every day | ORAL | 1 refills | Status: AC
Start: 2022-12-31 — End: 2023-01-01

## 2022-12-31 MED ORDER — ATENOLOL 25 MG PO TABS
25 | ORAL_TABLET | Freq: Every day | ORAL | 0 refills | Status: DC
Start: 2022-12-31 — End: 2022-12-31

## 2023-01-01 ENCOUNTER — Encounter

## 2023-01-01 MED ORDER — ATENOLOL 25 MG PO TABS
25 MG | ORAL_TABLET | Freq: Every day | ORAL | 0 refills | Status: AC
Start: 2023-01-01 — End: 2023-02-24

## 2023-01-05 LAB — MISCELLANEOUS SENDOUT

## 2023-02-08 ENCOUNTER — Encounter: Admit: 2023-02-08 | Admitting: Family

## 2023-02-08 DIAGNOSIS — J069 Acute upper respiratory infection, unspecified: Secondary | ICD-10-CM

## 2023-02-09 MED ORDER — PROMETHAZINE HCL 6.25 MG/5ML PO SOLN
6.25 | Freq: Four times a day (QID) | ORAL | 0 refills | Status: AC | PRN
Start: 2023-02-09 — End: 2023-02-15

## 2023-02-09 MED ORDER — AMOXICILLIN-POT CLAVULANATE 875-125 MG PO TABS
875-125 | ORAL_TABLET | Freq: Two times a day (BID) | ORAL | 0 refills | Status: AC
Start: 2023-02-09 — End: 2023-02-15

## 2023-02-12 ENCOUNTER — Encounter: Payer: MEDICARE | Attending: Family | Primary: Family

## 2023-02-14 ENCOUNTER — Ambulatory Visit: Admit: 2023-02-14 | Discharge: 2023-02-14 | Payer: MEDICARE | Attending: Family | Primary: Family

## 2023-02-14 VITALS — BP 160/80 | HR 59 | Temp 97.20000°F | Resp 20 | Ht 62.0 in | Wt 194.0 lb

## 2023-02-14 DIAGNOSIS — J069 Acute upper respiratory infection, unspecified: Secondary | ICD-10-CM

## 2023-02-14 MED ORDER — PREDNISONE 10 MG PO TABS
10 | ORAL_TABLET | ORAL | 0 refills | Status: AC
Start: 2023-02-14 — End: 2023-02-24

## 2023-02-14 MED ORDER — CEFDINIR 300 MG PO CAPS
300 | ORAL_CAPSULE | Freq: Two times a day (BID) | ORAL | 0 refills | Status: AC
Start: 2023-02-14 — End: 2023-02-21

## 2023-02-14 MED ORDER — ALBUTEROL SULFATE HFA 108 (90 BASE) MCG/ACT IN AERS
10890 (90 Base) MCG/ACT | Freq: Four times a day (QID) | RESPIRATORY_TRACT | 0 refills | Status: AC | PRN
Start: 2023-02-14 — End: 2023-03-26

## 2023-02-14 MED ORDER — DOXYCYCLINE HYCLATE 100 MG PO CAPS
100 | ORAL_CAPSULE | Freq: Two times a day (BID) | ORAL | 0 refills | Status: DC
Start: 2023-02-14 — End: 2023-02-16

## 2023-02-14 MED ORDER — FLUCONAZOLE 150 MG PO TABS
150 | ORAL_TABLET | ORAL | 0 refills | Status: AC
Start: 2023-02-14 — End: 2023-02-20

## 2023-02-14 MED ORDER — GUAIFENESIN ER 600 MG PO TB12
600 | ORAL_TABLET | Freq: Two times a day (BID) | ORAL | 0 refills | Status: AC
Start: 2023-02-14 — End: 2023-03-01

## 2023-02-14 MED ORDER — TRIAMCINOLONE ACETONIDE 40 MG/ML IJ SUSP
40 | Freq: Once | INTRAMUSCULAR | Status: AC
Start: 2023-02-14 — End: 2023-02-14
  Administered 2023-02-14: 22:00:00 40 mg via INTRAMUSCULAR

## 2023-02-14 NOTE — Progress Notes (Unsigned)
No chief complaint on file.      HPI                        Congested for 2 weeks  after getting back from [place]  Headache, congested, throat sore, chest tight  Fever over a hundred on Sunday.    Sputum was thick and green.  Not producing any mucus now.  Hypertension  Currently on valsartan and Cardizem  BP Readings from Last 3 Encounters:   11/13/22 138/68   10/18/22 123/63   10/04/22 124/72      DHF  Currently on Bumex, Cardizem and Losartan.  She follow with Dr. Arnette Norris.     Echo 08/21/22    Left Ventricle: Normal left ventricular systolic function with a visually estimated EF of 55 - 60%. Left ventricle size is normal. Moderate posterior thickening. Normal wall motion. Grade II diastolic dysfunction with increased LAP.    Aortic Valve: Mild sclerosis of the aortic valve cusp.    Mitral Valve: Mild annular calcification of the mitral valve. Mild regurgitation.    Left Atrium: Left atrium is mildly dilated.    Image quality is good.      Patient Active Problem List   Diagnosis    Bipolar I  disorder (HCC)    Chronic diastolic CHF (congestive heart failure) (HCC)    Essential hypertension    Anxiety    Arthritis    Diverticula of intestine    Complication of gastric banding       Past Medical History:   Diagnosis Date    Bipolar 1 disorder (HCC)     Diverticular disease     Hypertension     Hypothyroidism     Spinal stenosis        Past Surgical History:   Procedure Laterality Date    APPENDECTOMY      BACK SURGERY      CHOLECYSTECTOMY      CHOLECYSTECTOMY      COLONOSCOPY      COLONOSCOPY      HYSTERECTOMY (CERVIX STATUS UNKNOWN)      KNEE SURGERY      torn meninscus    LAP BAND      LUMBAR LAMINECTOMY      OVARY REMOVAL      UPPER GASTROINTESTINAL ENDOSCOPY N/A 09/04/2022    ESOPHAGOGASTRODUODENOSCOPY BIOPSY performed by Arman Filter, MD at [facility] ENDOSCOPY       Current Outpatient Medications   Medication Sig Dispense Refill    amoxicillin-clavulanate (AUGMENTIN) 875-125 MG per tablet Take 1 tablet by mouth 2 times daily for 7 days 14 tablet 0    promethazine (PHENERGAN) 6.25 MG/5ML syrup Take 5 mLs by mouth 4 times daily as needed for Nausea 118 mL 0    atenolol (TENORMIN) 25 MG tablet Take 1 tablet by mouth daily 14 tablet 0    levothyroxine (SYNTHROID) 100 MCG tablet Take 1 tablet by mouth daily 90 tablet 1    metFORMIN (GLUCOPHAGE) 500 MG tablet Take 1 tablet by mouth 2 times daily (with meals) 180 tablet 1    valsartan (DIOVAN) 320 MG tablet TAKE 1 TABLET BY MOUTH DAILY 90 tablet 1    torsemide (DEMADEX) 20 MG tablet Take 1 tablet by mouth daily (Patient taking differently: Take 2 tablets by mouth daily) 90 tablet 1    pantoprazole (PROTONIX) 40 MG tablet TAKE 1 TABLET BY MOUTH EVERY MORNING BEFORE BREAKFAST 90 tablet 1    Cholecalciferol (VITAMIN D3) 1000 units CAPS Take 1 capsule by mouth daily      Multiple Vitamin (MULTIVITAMIN ADULT PO) Take 1 capsule by mouth daily      B-Complex TABS Take 1 tablet by mouth daily      buPROPion (WELLBUTRIN XL) 150 MG extended release tablet Take 1 tablet  by mouth every morning      lamoTRIgine (LAMICTAL) 200 MG tablet Take 1  tablet by mouth daily      zinc sulfate (ZINCATE) 220 (50 Zn) MG capsule Take 1 capsule by mouth daily      ALPRAZolam (XANAX) 1 MG tablet Take 1 tablet by mouth daily as needed for Anxiety. 2 a day as needed       No current facility-administered medications for this visit.       Allergies   Allergen Reactions    Amlodipine Shortness Of Breath       Social History     Socioeconomic History    Marital status: Divorced   Tobacco Use    Smoking status: Former     Types: Cigarettes    Smokeless tobacco: Never    Tobacco comments:     Smoked on and off.  Haven't smoked for about 20 years   Vaping Use    Vaping status: Every Day    Substances: THC   Substance and Sexual Activity    Alcohol use: Yes     Comment: wine daily    Drug use: Yes     Types: Marijuana Sheran Fava)     Comment: THC/medical marijuana card    Sexual activity: Not Currently     Partners: Male     Social Determinants of Health     Physical Activity: Inactive (04/12/2022)    Exercise Vital Sign     Days of Exercise per Week: 0 days     Minutes of Exercise per Session: 0 min       Family History   Problem Relation Age of Onset    Amyotrophic lateral sclerosis Mother     High Blood Pressure Father     Thyroid Disease Father     Gout Father     Stroke Father     Cancer Sister         throat, esophagus, tongue, vaginal    Hemochromatosis Sister     Leukemia Brother     Hypertension Daughter     Thyroid Disease Daughter     Hemochromatosis Son     Liver Disease Son     Gout Son           Review of Systems   Constitutional:  Negative for activity change and fatigue.   HENT:  Negative for congestion, postnasal drip and sore throat.    Eyes:  Negative for redness and visual disturbance.   Respiratory:  Negative for shortness of breath and wheezing.    Cardiovascular:  Negative for chest pain and leg swelling.   Gastrointestinal:  Negative for abdominal pain, constipation and diarrhea.    Genitourinary:  Negative for dysuria, flank pain and frequency.   Musculoskeletal:  Negative for arthralgias, back pain and myalgias.   Skin:  Negative for color change and rash.   Neurological:  Negative for dizziness and headaches.   Psychiatric/Behavioral:  Negative for sleep disturbance. The patient is not nervous/anxious.         Anxiety       There were no vitals taken for this visit.    BP Readings from Last 3 Encounters:   11/13/22 138/68   10/18/22 123/63   10/04/22 124/72       Wt Readings from Last 3 Encounters:   11/13/22 88.5 kg (195 lb)   10/18/22 90.4 kg (199 lb 4.8 oz)   10/04/22 87.5 kg (193 lb)       Physical Exam  Constitutional:       General: She is awake.  Appearance: Normal appearance.   HENT:      Head: Normocephalic and atraumatic.      Right Ear: Tympanic membrane, ear canal and external ear normal.      Left Ear: Tympanic membrane, ear canal and external ear normal.      Nose: Nose normal.      Mouth/Throat:      Mouth: Mucous membranes are moist.   Eyes:      General: Lids are normal.      Extraocular Movements: Extraocular movements intact.      Conjunctiva/sclera: Conjunctivae normal.      Pupils: Pupils are equal, round, and reactive to light.   Neck:      Trachea: Phonation normal.   Cardiovascular:      Rate and Rhythm: Normal rate and regular rhythm.      Pulses: Normal pulses.      Heart sounds: Normal heart sounds.   Pulmonary:      Effort: Pulmonary effort is normal.      Breath sounds: Normal breath sounds.   Abdominal:      General: Bowel sounds are normal.      Palpations: Abdomen is soft.   Musculoskeletal:         General: Normal range of motion.      Cervical back: Normal range of motion and neck supple.      Right lower leg: No edema.      Left lower leg: No edema.   Feet:      Right foot:      Skin integrity: Skin integrity normal.      Left foot:      Skin integrity: Skin integrity normal.   Skin:     General: Skin is warm and dry.      Capillary Refill: Capillary  refill takes less than 2 seconds.   Neurological:      General: No focal deficit present.      Mental Status: She is alert and oriented to person, place, and time.   Psychiatric:         Attention and Perception: Attention and perception normal.         Mood and Affect: Mood normal.         Speech: Speech normal.         Behavior: Behavior normal. Behavior is cooperative.         Cognition and Memory: Cognition normal.         CBC with Differential:    Lab Results   Component Value Date/Time    WBC 3.8 12/24/2022 07:33 AM    RBC 3.65 12/24/2022 07:33 AM    HGB 12.9 12/24/2022 07:33 AM    HCT 40.2 12/24/2022 07:33 AM    PLT 167 12/24/2022 07:33 AM    MCV 110.1 12/24/2022 07:33 AM    MCH 35.3 12/24/2022 07:33 AM    MCHC 32.1 12/24/2022 07:33 AM    RDW 12.8 12/24/2022 07:33 AM    LYMPHOPCT 42 12/24/2022 07:33 AM    MONOPCT 10 12/24/2022 07:33 AM    EOSPCT 3 12/24/2022 07:33 AM    BASOPCT 2 12/24/2022 07:33 AM    MONOSABS 0.39 12/24/2022 07:33 AM    LYMPHSABS 1.58 12/24/2022 07:33 AM    EOSABS 0.10 12/24/2022 07:33 AM    BASOSABS 0.08 12/24/2022 07:33 AM     CMP:    Lab Results   Component Value Date/Time    NA 142 12/24/2022 07:33 AM  K 4.7 12/24/2022 07:33 AM    CL 102 12/24/2022 07:33 AM    CO2 25 12/24/2022 07:33 AM    BUN 18 12/24/2022 07:33 AM    CREATININE 1.2 12/24/2022 07:33 AM    GFRAA >60 12/09/2019 03:50 AM    LABGLOM 48 12/24/2022 07:33 AM    LABGLOM 61 06/08/2022 12:05 PM    GLUCOSE 83 12/24/2022 07:33 AM    CALCIUM 9.8 12/24/2022 07:33 AM    BILITOT 0.3 12/24/2022 07:33 AM    ALKPHOS 142 12/24/2022 07:33 AM    AST 37 12/24/2022 07:33 AM    ALT 25 12/24/2022 07:33 AM        Patient Active Problem List   Diagnosis    Bipolar I disorder (HCC)    Chronic diastolic CHF (congestive heart failure) (HCC)    Essential hypertension    Anxiety    Arthritis    Diverticula of intestine    Complication of gastric banding       ASSESSMENT/PLAN:    There are no diagnoses linked to this encounter.    Follow up in 3  months.    Vilinda Boehringer, APRN - CNP,   11/18/22  3:08 PM EDT

## 2023-02-16 MED ORDER — AZITHROMYCIN 250 MG PO TABS
250 | ORAL_TABLET | ORAL | 0 refills | Status: AC
Start: 2023-02-16 — End: 2023-02-26

## 2023-02-17 ENCOUNTER — Inpatient Hospital Stay
Admit: 2023-02-17 | Discharge: 2023-02-18 | Disposition: A | Discharge status: Home / Self Care | Payer: MEDICARE | Attending: Emergency Medicine

## 2023-02-17 ENCOUNTER — Emergency Department: Admit: 2023-02-17 | Payer: MEDICARE | Primary: Family

## 2023-02-17 DIAGNOSIS — R5381 Other malaise: Secondary | ICD-10-CM

## 2023-02-17 DIAGNOSIS — J069 Acute upper respiratory infection, unspecified: Secondary | ICD-10-CM

## 2023-02-17 LAB — COMPREHENSIVE METABOLIC PANEL
ALT: 29 U/L (ref 0–32)
AST: 37 U/L — ABNORMAL HIGH (ref 0–31)
Albumin: 4.6 g/dL (ref 3.5–5.2)
Alkaline Phosphatase: 145 U/L — ABNORMAL HIGH (ref 35–104)
Anion Gap: 13 mmol/L (ref 7–16)
BUN: 20 mg/dL (ref 6–23)
CO2: 24 mmol/L (ref 22–29)
Calcium: 9.8 mg/dL (ref 8.6–10.2)
Chloride: 92 mmol/L — ABNORMAL LOW (ref 98–107)
Creatinine: 1.1 mg/dL — ABNORMAL HIGH (ref 0.50–1.00)
Est, Glom Filt Rate: 53 mL/min/{1.73_m2} — ABNORMAL LOW (ref >60)
Glucose: 132 mg/dL — ABNORMAL HIGH (ref 74–99)
Potassium: 4.2 mmol/L (ref 3.5–5.0)
Sodium: 129 mmol/L — ABNORMAL LOW (ref 132–146)
Total Bilirubin: 0.6 mg/dL (ref 0.0–1.2)
Total Protein: 7.2 g/dL (ref 6.4–8.3)

## 2023-02-17 LAB — CBC WITH AUTO DIFFERENTIAL
Basophils %: 0 % (ref 0.0–2.0)
Basophils Absolute: 0.02 10*3/uL (ref 0.00–0.20)
Eosinophils %: 0 % (ref 0–6)
Eosinophils Absolute: 0 10*3/uL — ABNORMAL LOW (ref 0.05–0.50)
Hematocrit: 40.3 % (ref 34.0–48.0)
Hemoglobin: 14.4 g/dL (ref 11.5–15.5)
Immature Granulocytes %: 1 % (ref 0.0–5.0)
Immature Granulocytes Absolute: 0.05 10*3/uL (ref 0.00–0.58)
Lymphocytes %: 20 % (ref 20.0–42.0)
Lymphocytes Absolute: 1.43 10*3/uL — ABNORMAL LOW (ref 1.50–4.00)
MCH: 36 pg — ABNORMAL HIGH (ref 26.0–35.0)
MCHC: 35.7 g/dL — ABNORMAL HIGH (ref 32.0–34.5)
MCV: 100.8 fL — ABNORMAL HIGH (ref 80.0–99.9)
MPV: 10.1 fL (ref 7.0–12.0)
Monocytes %: 5 % (ref 2.0–12.0)
Monocytes Absolute: 0.33 10*3/uL (ref 0.10–0.95)
Neutrophils %: 74 % (ref 43.0–80.0)
Neutrophils Absolute: 5.24 10*3/uL (ref 1.80–7.30)
Platelets: 213 10*3/uL (ref 130–450)
RBC: 4 m/uL (ref 3.50–5.50)
RDW: 11.5 % (ref 11.5–15.0)
WBC: 7.1 10*3/uL (ref 4.5–11.5)

## 2023-02-17 LAB — COVID-19 & INFLUENZA COMBO
Influenza A: NOT DETECTED
Influenza B: NOT DETECTED
SARS-CoV-2 RNA, RT PCR: NOT DETECTED

## 2023-02-17 LAB — TROPONIN
Troponin, High Sensitivity: 12 ng/L — ABNORMAL HIGH (ref 0–9)
Troponin, High Sensitivity: 11 ng/L — ABNORMAL HIGH (ref 0–9)

## 2023-02-17 LAB — BRAIN NATRIURETIC PEPTIDE: NT Pro-BNP: 802 pg/mL — ABNORMAL HIGH (ref 0–450)

## 2023-02-17 NOTE — ED Notes (Signed)
 Pt maintained 96% the entire time while ambulating with no complaints

## 2023-02-17 NOTE — ED Provider Notes (Signed)
 Department of Emergency Medicine   ED Provider Note  Admit Date/RoomTime: 02/17/2023  2:14 PM  ED Room: 18/18          History of Present Illness:  02/17/23, Time: 2:29 PM EST      Patient is a 72 y.o. female presents with a chief complaint of SOB and ches

## 2023-02-17 NOTE — Discharge Instructions (Addendum)
 Follow-up with PCP.  Return to ED if symptoms change or worsen.

## 2023-02-18 LAB — EKG 12-LEAD
Atrial Rate: 64 {beats}/min
P Axis: 78 degrees
P-R Interval: 198 ms
Q-T Interval: 376 ms
QRS Duration: 100 ms
QTc Calculation (Bazett): 387 ms
R Axis: -39 degrees
T Axis: 18 degrees
Ventricular Rate: 64 {beats}/min

## 2023-02-24 ENCOUNTER — Encounter

## 2023-02-25 MED ORDER — ATENOLOL 25 MG PO TABS
25 MG | ORAL_TABLET | Freq: Every day | ORAL | 0 refills | Status: DC
Start: 2023-02-25 — End: 2023-04-06

## 2023-02-25 MED ORDER — VALSARTAN 320 MG PO TABS
320 | ORAL_TABLET | Freq: Every day | ORAL | 1 refills | Status: AC
Start: 2023-02-25 — End: ?

## 2023-03-04 ENCOUNTER — Ambulatory Visit
Admit: 2023-03-04 | Discharge: 2023-03-04 | Payer: MEDICARE | Attending: Orthopaedic Surgery | Admitting: Orthopaedic Surgery | Primary: Family

## 2023-03-04 VITALS — Temp 97.60000°F | Ht 62.0 in | Wt 189.0 lb

## 2023-03-04 DIAGNOSIS — G5601 Carpal tunnel syndrome, right upper limb: Principal | ICD-10-CM

## 2023-03-04 NOTE — Progress Notes (Signed)
 Chief Complaint   Patient presents with    Wrist Pain     Patient presents for bilateral wrist pain and numbness. She states her right has been bothering her about 6 months, and her left 2 months. Patient is having trouble with strength and her pain radiat

## 2023-03-20 ENCOUNTER — Encounter

## 2023-03-25 ENCOUNTER — Ambulatory Visit
Admit: 2023-03-25 | Discharge: 2023-03-25 | Payer: MEDICARE | Attending: Physical Medicine & Rehabilitation | Primary: Family

## 2023-03-25 VITALS — Ht 61.0 in | Wt 186.0 lb

## 2023-03-25 DIAGNOSIS — G5601 Carpal tunnel syndrome, right upper limb: Secondary | ICD-10-CM

## 2023-03-25 NOTE — Patient Instructions (Signed)
Electrodiagnotic Laboratory  Accredited by the AANEM with Exemplary status  [employer], D.O  Rivka Barbara, D.O.   [facility]  [address]  [place], [place] [address]  Phone: [phone]  Fax: [phone]        Today you had an electrodiagnostic exam which included nerve conduction studies (NCS) and electromyography (EMG). This test evaluated the electrical activity of your nerves and muscles to help determine if you have a nerve or muscle disease.  This test can help determine the location and type of a nerve or muscle problem. This will help your referring doctor diagnose your condition and determine the appropriate next step in your treatment plan.     After your test:    1. There are no long lasting side effects of the test.     2. You may resume your normal activities without restrictions.     3.  Resume any medications that were stopped for the test.     4  If you have sore areas or bruising in your muscles where the needle was placed, apply a cold pack to the sore area for 15-20 minutes three to four times a day as needed for pain.  The soreness should go away in about 1-2 days.     5. Your results were provided  Briefly at the end of your test and the final detailed report will be provided to your referring physician, and/or primary care physician and any other parties you requested within 1-2 days of the examination. You may wish to contact your referring provider after a few days to determine what they would like you to do next.     6.  Please call [phone] with any questions or concerns and if you develop increased body temperature/fever, swelling, tenderness, increased pain and/or drainage from the sites where the needle was placed.     Thank you for choosing [place] for your health care needs.

## 2023-03-25 NOTE — Progress Notes (Signed)
[facility]  Electrodiagnostic Laboratory  *Accredited by the AANEM with exemplary status  [address]  [place], [place] [address]  Phone: [phone]  Fax: [phone]    Referring Provider: Girtha Hake, DO  Primary Care Physician: Vilinda Boehringer, APRN - CNP  Patient Name: Margaret Zimmerman  Patient birth date: 09/18/1950  Gender: female  BMI: Body mass index is 35.14 kg/m.  Height 1.549 m (5\' 1" ), weight 84.4 kg (186 lb).    03/27/2023    Reason for Referral: Carpal tunnel    Description of clinical problem:   Chief Complaint   Patient presents with    Extremity Pain     Pain in the right bicep area at times, this comes and goes.     Numbness     Numbness/tingling in the fingers. Worse in the thumb,pointer and middle. At least 6 months of symp. Right is worse.     Extremity Weakness     Decrease strength in the right hand.     Sensory NCS      Nerve / Sites Rec. Site Peak Lat PP Amp Segments Distance Velocity Temp.     ms V  cm m/s C   R Median - Digit II (Antidromic)      Palm Dig II 1.67 28.4 Palm - Dig II 7 64 32.5      Wrist Dig II 3.33 17.7 Wrist - Dig II 14 54 32.5   R Ulnar - Digit V (Antidromic)      Wrist Dig V 3.91 27.8 Wrist - Dig V 14 45 33.1   R Radial - Anatomical snuff box (Forearm)      Forearm Wrist 2.29 13.4 Forearm - Wrist 10 58 32.5       Combined Sensory Index      Nerve / Sites Rec. Site Peak Lat NP Amp PP Amp Segments Dist. Peak Diff Temp.     ms V V  cm ms C   R Median - CSI      Median Thumb 3.02 15.8 82.6 Median - Radial 10 0.05 32      Radial Thumb 2.97 5.9 12.1 Median - Ulnar 14 0.05 32      Median Ring 3.70 9.2 8.5 Median palm - Ulnar palm 8  32      Ulnar Ring 3.65 1.1 7.5          Ulnar palm Wrist 1.88 67.7 49.2          7 Wrist 1.72 9.9 8.5          CSI     CSI  0.10    L Median - CSI      Median Thumb 3.33 8.2 13.4 Median - Radial 10 -0.05 32      Radial Thumb 3.39 11.2 13.0 Median - Ulnar 14 -0.36 32      Median Ring 3.59 4.8 7.7 Median palm -  Ulnar palm 8 -0.31 32      Ulnar Ring 3.96 4.9 16.9          Median palm Wrist 1.72 58.8 81.3          Ulnar palm Wrist 2.03 0.85 11.6          CSI     CSI  0.73        Motor NCS      Nerve / Sites Muscle Onset Amplitude Segments Distance Velocity Temp.     ms mV  cm m/s  C   R Median - APB      Palm APB 2.45 11.2 Palm - APB   32      Wrist APB 3.85 11.1 Wrist - Palm 8 57 32      Elbow APB 7.81 10.9 Elbow - Wrist 19 48 32   R Ulnar - ADM      Wrist ADM 3.65 11.0 Wrist - ADM 8  32      B.Elbow ADM 6.51 10.7 B.Elbow - Wrist 15 52 32      A.Elbow ADM 8.28 10.3 A.Elbow - B.Elbow 10 56 32       F Wave      Nerve Fmin % F    ms %   R Median - APB 27.76 20   R Ulnar - ADM 26.67 60       EMG      EMG Summary Table     Spontaneous MUAP Recruitment   Muscle Nerve Roots IA Fib PSW Fasc Amp Dur. PPP Pattern   R. Biceps brachii Musculocutaneous C5-C6 N None None None N N N N   R. Triceps brachii Radial C6-C8 N None None None N N N N   R. Pronator teres Median C6-C7 N None None None N N N N   R. First dorsal interosseous Ulnar C8-T1 N None None None N N N N   R. Abductor pollicis brevis Median C8-T1 N None None None N N N N   L. Biceps brachii Musculocutaneous C5-C6 N None None None N N N N   L. Triceps brachii Radial C6-C8 N None None None N N N N   L. Pronator teres Median C6-C7 N None None None N N N N   L. First dorsal interosseous Ulnar C8-T1 N None None None N N N N   L. Abductor pollicis brevis Median C8-T1 N None None None N N N N   L. Cervical paraspinals (low)  - N None None None NT NT NT NT   L. Cervical paraspinals (mid)  - N None None None NT NT NT NT        Study Limitations:  none    Summary of Findings:   Nerve conduction studies:   All nerve conduction studies, as listed in the table were normal in latency, amplitude and conduction velocity.     Needle EMG:   Needle EMG was performed using a concentric  needle.  Observed motor units were normal in amplitude, duration, phases and recruitment and no active  denervation signs were seen.        Diagnostic Interpretation:   This study was normal.  Specifically, no electrodiagnostic evidence of bilateral median mononeuropathy at the wrist or C5 through T1 radiculopathy. EMG is a very sensitive test for the presence of and approximate location of radiculopathy but  can be completely normal in the first 10-14 days after symptom onset, in a purely demyelinating lesion and in sensory radiculopathy where the sensory nerve root is predominantly affected. Paraspinal muscles can be spared in radiculopathy and the abnormality of paraspinals is useful in identifying a radiculopathy but not in localizing the segmental level. Spinal stenosis defined by intermittent compression of nerve roots causing claudication and in mild to moderate cases seldom causes abnormality on EMG.     Previous Study: There is not a prior study for comparison.    Follow up EMG is recommended if clinically warranted.     Technologist: Kelby Aline  Physician:  Rivka Barbara, D.O., P.T.  Board Certified Physical Medicine and Rehabilitation  Board Certified Electrodiagnostic Medicine    Nerve conduction studies and electromyography were performed according to our laboratory policies and procedures which can be provided upon request. All abnormal values are identified in the table. Laboratory normal values can also be provided upon request.       Cc: Girtha Hake, DO  Vilinda Boehringer, APRN - CNP

## 2023-03-25 NOTE — Progress Notes (Signed)
Electrodiagnostic Laboratory  *Accredited by the AANEM with exemplary status  [address]  [place], [place] [address]  Phone: [phone]  Fax: [phone]      Date of Examination: 03/27/23    Patient Name: Margaret Zimmerman    An independent historian was not needed.     ALANE HANSSEN  is a 73 y.o. year old female who was seen today regarding   Chief Complaint   Patient presents with    Extremity Pain     Pain in the right bicep area at times, this comes and goes.     Numbness     Numbness/tingling in the fingers. Worse in the thumb,pointer and middle. At least 6 months of symp. Right is worse.     Extremity Weakness     Decrease strength in the right hand.   The symptoms are intermittent.       I have reviewed the referring provider's office note.    There is not a family history of neuromuscular conditions.     Physical Exam: General: The patient is in no apparent distress.  MSK: There is no joint effusion, deformity, instability, swelling, erythema or warmth.  AROM is full in the spine and extremities. +Tinel bilateral wrists. Neurologic:  No focal sensorimotor deficit.  Reflexes 2+ and symmetric. Gait is normal.    Impression:     1. Carpal tunnel syndrome on right    2. Carpal tunnel syndrome on left        Plan:   EMG is indicated to evaluate the above diagnosis.    EMG was done today and showed a normal study.   Specifically, no electrodiagnostic evidence of bilateral median mononeuropathy at the wrist or C5 through T1 radiculopathy. EMG is a very sensitive test for the presence of and approximate location of radiculopathy but  can be completely normal in the first 10-14 days after symptom onset, in a purely demyelinating lesion and in sensory radiculopathy where the sensory nerve root is predominantly affected. Paraspinal muscles can be spared in radiculopathy and the abnormality of paraspinals is useful in identifying a radiculopathy but not in localizing the segmental level. Spinal  stenosis defined by intermittent compression of nerve roots causing claudication and in mild to moderate cases seldom causes abnormality on EMG.   Consider correlation with cervical spine imaging and conservative treatment. If symptoms worsen or fail to improve consider repeating EMG in 6 months to one year.  The patient was educated about the diagnosis and the prognosis.   Advised patient to follow up with referring provider.       Thank you for allowing me to participate in the care of your patient.      Sincerely,       Rivka Barbara, D.O., P.T.  Board Certified Physical Medicine and Rehabilitation  Board Certified Electrodiagnostic Medicine

## 2023-03-26 ENCOUNTER — Ambulatory Visit: Admit: 2023-03-26 | Discharge: 2023-03-26 | Payer: MEDICARE | Attending: Orthopaedic Surgery | Primary: Family

## 2023-03-26 ENCOUNTER — Ambulatory Visit: Admit: 2023-03-26 | Payer: MEDICARE | Primary: Family

## 2023-03-26 VITALS — Ht 61.0 in | Wt 186.0 lb

## 2023-03-26 DIAGNOSIS — M48061 Spinal stenosis, lumbar region without neurogenic claudication: Secondary | ICD-10-CM

## 2023-03-26 DIAGNOSIS — M25552 Pain in left hip: Secondary | ICD-10-CM

## 2023-03-26 NOTE — Progress Notes (Signed)
Chief Complaint   Patient presents with    Hip Pain     Patient presents for left hip pain. Patient states the pain has been present since November 2024. Patient states she went on vacation and noticed the pain after she came home.         Margaret Zimmerman  Is a 73 y.o.  Caucasian female who presents today complaining of left hip pain. She states that the pain began 3  month(s) ago. She did not have a history of injury.  Patients states pain is located posterior and has tenderness over the  Posterior portion of the hip. Hip pain is described as aching and  is worse with weight bearing and is aggravated by walking along with buttock discomfort. She states the pain occurs in the  no apparent pattern. Patient states hip pain is relieved by rest. Patient does have a history of back pain.  The patient does not use ambulatory aid.     Past Medical History:   Diagnosis Date    Bipolar 1 disorder (HCC)     Diverticular disease     Hypertension     Hypothyroidism     Spinal stenosis      Past Surgical History:   Procedure Laterality Date    APPENDECTOMY      BACK SURGERY      CHOLECYSTECTOMY      CHOLECYSTECTOMY      COLONOSCOPY      COLONOSCOPY      HYSTERECTOMY (CERVIX STATUS UNKNOWN)      KNEE SURGERY      torn meninscus    LAP BAND      LUMBAR LAMINECTOMY      OVARY REMOVAL      UPPER GASTROINTESTINAL ENDOSCOPY N/A 09/04/2022    ESOPHAGOGASTRODUODENOSCOPY BIOPSY performed by Arman Filter, MD at [facility] ENDOSCOPY       Current Outpatient Medications:     atenolol (TENORMIN) 25 MG tablet, Take 1 tablet by mouth daily, Disp: 14 tablet, Rfl: 0    valsartan (DIOVAN) 320 MG tablet, Take 1 tablet by mouth daily, Disp: 90 tablet, Rfl: 1    levothyroxine (SYNTHROID) 100 MCG tablet, Take 1 tablet by mouth daily, Disp: 90 tablet, Rfl: 1    metFORMIN (GLUCOPHAGE) 500 MG tablet, Take 1 tablet by mouth 2 times daily (with meals), Disp: 180 tablet, Rfl: 1    torsemide (DEMADEX) 20 MG tablet, Take 1 tablet by mouth daily (Patient taking  differently: Take 2 tablets by mouth daily), Disp: 90 tablet, Rfl: 1    pantoprazole (PROTONIX) 40 MG tablet, TAKE 1 TABLET BY MOUTH EVERY MORNING BEFORE BREAKFAST, Disp: 90 tablet, Rfl: 1    Cholecalciferol (VITAMIN D3) 1000 units CAPS, Take 1 capsule by mouth daily, Disp: , Rfl:     Multiple Vitamin (MULTIVITAMIN ADULT PO), Take 1 capsule by mouth daily, Disp: , Rfl:     B-Complex TABS, Take 1 tablet by mouth daily, Disp: , Rfl:     buPROPion (WELLBUTRIN XL) 150 MG extended release tablet, Take 1 tablet by mouth every morning, Disp: , Rfl:     lamoTRIgine (LAMICTAL) 200 MG tablet, Take 1 tablet by mouth daily, Disp: , Rfl:     zinc sulfate (ZINCATE) 220 (50 Zn) MG capsule, Take 1 capsule by mouth daily, Disp: , Rfl:     ALPRAZolam (XANAX) 1 MG tablet, Take 1 tablet by mouth daily as needed for Anxiety. 2 a day as needed, Disp: , Rfl:  Allergies   Allergen Reactions    Amlodipine Shortness Of Breath     Social History     Socioeconomic History    Marital status: Divorced     Spouse name: Not on file    Number of children: Not on file    Years of education: Not on file    Highest education level: Not on file   Occupational History    Not on file   Tobacco Use    Smoking status: Former     Types: Cigarettes    Smokeless tobacco: Never    Tobacco comments:     Smoked on and off.  Haven't smoked for about 20 years   Vaping Use    Vaping status: Every Day    Substances: THC   Substance and Sexual Activity    Alcohol use: Yes     Comment: wine daily    Drug use: Yes     Types: Marijuana Sheran Fava)     Comment: THC/medical marijuana card    Sexual activity: Not Currently     Partners: Male   Other Topics Concern    Not on file   Social History Narrative    Not on file     Social Determinants of Health     Financial Resource Strain: Not on file   Food Insecurity: Not on file   Transportation Needs: Not on file   Physical Activity: Inactive (03/25/2023)    Exercise Vital Sign     Days of Exercise per Week: 0 days     Minutes of  Exercise per Session: 0 min   Stress: Not on file   Social Connections: Not on file   Intimate Partner Violence: Not on file   Housing Stability: Not on file     Family History   Problem Relation Age of Onset    Amyotrophic lateral sclerosis Mother     High Blood Pressure Father     Thyroid Disease Father     Gout Father     Stroke Father     Cancer Sister         throat, esophagus, tongue, vaginal    Hemochromatosis Sister     Leukemia Brother     Hypertension Daughter     Thyroid Disease Daughter     Hemochromatosis Son     Liver Disease Son     Gout Son          REVIEW OF SYSTEMS:     General/Constitution:  (-)weight loss, (-)fever, (-)chills, (-)weakness.   Skin: (-) rash,(-) psoriasis,(-) eczema, (-)skin cancer.   Musculoskeletal: (-) fractures,  (-) dislocations,(-) collagen vascular disease, (-) fibromyalgia, (-) multiple sclerosis, (-) muscular dystrophy, (-) RSD,(-) joint pain (-)swelling, (-) joint pain,swelling.  Neurologic: (-) epilepsy, (-)seizures,(-) brain tumor,(-) TIA, (-)stroke, (-)headaches, (-)Parkinson disease,(-) memory loss, (-) LOC.  Cardiovascular: (-) Chest pain, (-) swelling in legs/feet, (-) SOB, (-) cramping in legs/feet with walking.  Respiratory: (-) SOB, (-) Coughing, (-) night sweats.  GI: (-) nausea, (-) vomiting, (-) diarrhea, (-) blood in stool, (-) gastric ulcer.  Psychiatric: (-) Depression, (-) Anxiety, (-) bipolar disease, (-) Alzheimer's Disease  Allergic/Immunologic: (-) allergies latex, (-) allergies metal, (-) skin sensitivity.  Hematlogic: (-) anemia, (-) blood transfusion, (-) DVT/PE, (-) Clotting disorders      Subjective:    Constitution:    Ht 1.549 m (5\' 1" )   Wt 84.4 kg (186 lb)   BMI 35.14 kg/m     Psycihatric:  The  patient is alert and oriented x 3, appears to be stated age and in no distress.      Respiratory:  Respiratory effort is not labored.  Patient is not gasping.  Palpation of the chest reveals no tactile fremitus.    Skin:  Upon inspection: the skin  appears warm, dry and intact.  There is not a previous scar over the affected area.There is not any cellulitis, lymphedema or cutaneous lesions noted in the lower extremities.   Upon palpation there is no induration noted.      Neurologic:  Motor exam of the lower extremities show ; quadriceps, hamstrings, foot dorsi and plantar flexors intact R.  5/5 and L. 5/5. Deep tendon reflexes are 2/4 at the knees and 2/4 at the ankles with strong extensor hallicus longus motor strength bilaterally. Sensory to both feet is intact to all sensory roots.    Cardiovascular:  The vascular exam is normal and is well perfused to distal extremities.  Distal pulses DP/PT: R. 2+; L. 2+.  There is cap refill noted less than two seconds in all digits. There is not edema of the bilateral lower extremities.  There is not varicosities noted in the distal extremities.      Lymph:  Upon palpation,  there is no lymphadenopathy noted in bilateral lower extremities.      Musculoskeletal:  Gait: antalgic;  Examination of the digits and nails reveal no cyanosis or clubbing    Lumbar exam:  On visual inspection, there is not deformity of the spine.   full range of motion, no tenderness, palpable spasm or pain on motion. Special tests: Straight Leg Raise negative, Faber test negative.      Hip exam:  Upon visual inspection there is not a deformity noted. Patient complains of tenderness at the  buttock.     Exam of the left hip shows none leg length discrepancy.  Range of motion of the involved/uninvolved hip is 15 degrees internal rotation and 20 degrees external rotation/15 degrees internal rotation and 20 degrees external rotation, the hip joint is stable to testing. Strength of the lower extremity is normal.The patient does not have ipsilateral knee pain, and is described as  none.  Hip exam- Gait: antalgic; Strength: Hip Flexors 5/5; Hip Abductors 5/5; Hip Adduction 5/5.       Knee exam :  Upon visual inspection there is not deformity noted.   She does not have  pain on motion, there is not tenderness over the  global region.  Range of motion of R. Knee is 0 to 120, and L. Knee is 0 to 120.  there are not any masses, there is not ligamentous instability, there is not  deformity noted.      Xrays:  No acute abnormality of hip    Radiographic findings reviewed with patient    Impression:  Encounter Diagnoses   Name Primary?    Spinal stenosis of lumbar region, unspecified whether neurogenic claudication present Yes       Plan:  Natural history and expected course discussed. Questions answered.  Transport planner distributed.  Home exercises discussed.  OTC analgesics as needed.    I do not believe her issue is coming from her hip. I recommend to follow up with her surgeon who did her back surgery.    At least 30 minutes was spent discussing the diagnosis and treatment options with the patient with at least 50% of the time was spent with decision making and counseling the patient.

## 2023-04-06 ENCOUNTER — Encounter

## 2023-04-11 MED ORDER — ATENOLOL 25 MG PO TABS
25 MG | ORAL_TABLET | Freq: Every day | ORAL | 0 refills | Status: AC
Start: 2023-04-11 — End: 2023-04-19

## 2023-04-18 ENCOUNTER — Encounter: Payer: MEDICARE | Attending: Family | Primary: Family

## 2023-04-19 ENCOUNTER — Encounter

## 2023-04-19 MED ORDER — ATENOLOL 25 MG PO TABS
25 | ORAL_TABLET | Freq: Every day | ORAL | 1 refills | Status: AC
Start: 2023-04-19 — End: ?

## 2023-05-01 NOTE — Telephone Encounter (Signed)
 Called patient from recall list to schedule 6 month follow up. Left message with call back number.

## 2023-05-13 ENCOUNTER — Encounter

## 2023-05-27 ENCOUNTER — Ambulatory Visit: Admit: 2023-05-27 | Discharge: 2023-05-27 | Payer: MEDICARE | Attending: Internal Medicine | Primary: Family

## 2023-05-27 VITALS — BP 128/76 | HR 69 | Resp 16 | Ht 61.0 in | Wt 191.0 lb

## 2023-05-27 DIAGNOSIS — I5032 Chronic diastolic (congestive) heart failure: Secondary | ICD-10-CM

## 2023-05-27 NOTE — Progress Notes (Signed)
 [facility] [address] Cardiology progress note  Dr. Rolan Bucco      Reason for visit: Pedal edema  Referring Physician: Vilinda Boehringer, APRN - CNP     CHIEF COMPLAINT:   Chief Complaint   Patient presents with    Cardiac Clearance     06/27/23       HISTORY OF PRESENT ILLNESS:   Patient is 73 years old female with history of diastolic congestive heart failure, hypertension, hypothyroidism, is here for follow-up appointment.  Patient denies any chest pain, no shortness of breath, no lightheadedness, no dizziness, no palpitations, no pedal edema, no PND, no orthopnea, no syncope, no presyncopal episodes.  Functional capacity is at baseline limited by back pain (able to climb a flight of stairs without cardiac complaints)  Past Medical History:   Diagnosis Date    Bipolar 1 disorder (HCC)     Diverticular disease     Hypertension     Hypothyroidism     Spinal stenosis          Past Surgical History:   Procedure Laterality Date    APPENDECTOMY      BACK SURGERY      CHOLECYSTECTOMY      CHOLECYSTECTOMY      COLONOSCOPY      COLONOSCOPY      HYSTERECTOMY (CERVIX STATUS UNKNOWN)      KNEE SURGERY      torn meninscus    LAP BAND      LUMBAR LAMINECTOMY      OVARY REMOVAL      UPPER GASTROINTESTINAL ENDOSCOPY N/A 09/04/2022    ESOPHAGOGASTRODUODENOSCOPY BIOPSY performed by Arman Filter, MD at [facility] ENDOSCOPY         Current Outpatient Medications   Medication Sig Dispense Refill    Ascorbic Acid (VITAMIN C) 1000 MG tablet Take 1 tablet by mouth daily      vitamin B-6 (PYRIDOXINE) 100 MG tablet Take 1 tablet by mouth daily      dilTIAZem (CARDIZEM 12 HR) 120 MG extended release capsule Take 1 capsule by mouth daily      omeprazole (PRILOSEC) 40 MG delayed release capsule Take 1 capsule by mouth daily      atenolol (TENORMIN) 25 MG tablet Take 1 tablet by mouth daily 90 tablet 1    valsartan (DIOVAN) 320 MG tablet Take 1 tablet by mouth daily 90 tablet 1    levothyroxine (SYNTHROID) 100 MCG tablet Take 1 tablet by mouth daily (Patient  taking differently: Take 2.5 tablets by mouth daily) 90 tablet 1    torsemide (DEMADEX) 20 MG tablet Take 1 tablet by mouth daily (Patient taking differently: Take 2 tablets by mouth daily) 90 tablet 1    Cholecalciferol (VITAMIN D3) 1000 units CAPS Take 1 capsule by mouth daily      Multiple Vitamin (MULTIVITAMIN ADULT PO) Take 1 capsule by mouth daily      B-Complex TABS Take 1 tablet by mouth daily      buPROPion (WELLBUTRIN XL) 150 MG extended release tablet Take 1 tablet by mouth every morning      lamoTRIgine (LAMICTAL) 200 MG tablet Take 1 tablet by mouth daily      zinc sulfate (ZINCATE) 220 (50 Zn) MG capsule Take 1 capsule by mouth daily      ALPRAZolam (XANAX) 1 MG tablet Take 1 tablet by mouth daily as needed for Anxiety. 2 a day as needed      metFORMIN (GLUCOPHAGE) 500 MG tablet Take 1 tablet by  mouth 2 times daily (with meals) (Patient not taking: Reported on 05/27/2023) 180 tablet 1    pantoprazole (PROTONIX) 40 MG tablet TAKE 1 TABLET BY MOUTH EVERY MORNING BEFORE BREAKFAST (Patient not taking: Reported on 05/27/2023) 90 tablet 1     No current facility-administered medications for this visit.         Allergies as of 05/27/2023 - Fully Reviewed 05/27/2023   Allergen Reaction Noted    Amlodipine Shortness Of Breath 12/21/2022       Social History     Socioeconomic History    Marital status: Divorced     Spouse name: Not on file    Number of children: Not on file    Years of education: Not on file    Highest education level: Not on file   Occupational History    Not on file   Tobacco Use    Smoking status: Former     Types: Cigarettes    Smokeless tobacco: Never    Tobacco comments:     Smoked on and off.  Haven't smoked for about 20 years   Vaping Use    Vaping status: Every Day    Substances: THC   Substance and Sexual Activity    Alcohol use: Yes     Comment: wine daily    Drug use: Yes     Types: Marijuana Sheran Fava)     Comment: THC/medical marijuana card    Sexual activity: Not Currently     Partners:  Male   Other Topics Concern    Not on file   Social History Narrative    Not on file     Social Drivers of Health     Financial Resource Strain: Not on file   Food Insecurity: Not on file   Transportation Needs: Not on file   Physical Activity: Unknown (04/15/2023)    Exercise Vital Sign     Days of Exercise per Week: Patient declined     Minutes of Exercise per Session: 0 min   Recent Concern: Physical Activity - Inactive (03/25/2023)    Exercise Vital Sign     Days of Exercise per Week: 0 days     Minutes of Exercise per Session: 0 min   Stress: Not on file   Social Connections: Not on file   Intimate Partner Violence: Not on file   Housing Stability: Not on file       Family History   Problem Relation Age of Onset    Amyotrophic lateral sclerosis Mother     High Blood Pressure Father     Thyroid Disease Father     Gout Father     Stroke Father     Cancer Sister         throat, esophagus, tongue, vaginal    Hemochromatosis Sister     Leukemia Brother     Hypertension Daughter     Thyroid Disease Daughter     Hemochromatosis Son     Liver Disease Son     Gout Son        REVIEW OF SYSTEMS:     CONSTITUTIONAL:  negative for  fevers, chills, sweats and fatigue  HEENT:  negative for  tinnitus, earaches, nasal congestion and epistaxis  RESPIRATORY:  negative for  dry cough, cough with sputum,wheezing and hemoptysis  GASTROINTESTINAL:  negative for nausea, vomiting, diarrhea, constipation, pruritus and jaundice  HEMATOLOGIC/LYMPHATIC:  negative for easy bruising, bleeding, lymphadenopathy and petechiae  ENDOCRINE:  negative for heat  intolerance, cold intolerance, tremor, hair loss and diabetic symptoms including neither polyuria nor polydipsia nor blurred vision  MUSCULOSKELETAL:  negative for  myalgias, arthralgias, joint swelling, stiff joints and decreased range of motion  NEUROLOGICAL:  negative for memory problems, speech problems, visual disturbance, dysphagia, weakness and numbness      PHYSICAL EXAM:    CONSTITUTIONAL:  awake, alert, cooperative, no apparent distress, and appears stated age  HEAD:  normocepalic, without obvious abnormality, atraumatic, pink, moist mucous membranes.  NECK:  Supple, symmetrical, trachea midline, no adenopathy, thyroid symmetric, not enlarged and no tenderness, skin normal  HEMATOLOGIC/LYMPHATICS:  no cervical lymphadenopathy and no supraclavicular lymphadenopathy  LUNGS:  No increased work of breathing, good air exchange, clear to auscultation bilaterally, no crackles or wheezing  CARDIOVASCULAR:  Normal apical impulse, regular rate and rhythm, normal S1 and S2, no S3 or S4, and no murmur noted and no JVD, no carotid bruit, trace pedal edema, good carotid upstroke bilaterally.  ABDOMEN:  Soft, nontender, no masses, no hepatomegaly or splenomegaly, BS+  CHEST: nontender to palpation, expands symmetrically  MUSCULOSKELETAL:  No clubbing no cyanosis.there is no redness, warmth, or swelling of the joints  full range of motion noted  NEUROLOGIC:  Alert, awake,oriented  SKIN:  no bruising or bleeding, normal skin color    BP 128/76   Pulse 69   Resp 16   Ht 1.549 m (5\' 1" )   Wt 86.6 kg (191 lb)   BMI 36.09 kg/m     DATA:   I personally reviewed the visit EKG with the following interpretation: Sinus rhythm, first-degree AV block, poor R wave progression, possible LVH by voltage criteria normal axis no change when compared to    EKG 10/04/2022, sinus rhythm, first-degree AV block, poor R wave progression, normal axis    EKG 06/28/2022, sinus rhythm, possible old anteroseptal wall MI age undetermined, no previous to compare to.    ECHO: 08/17/2022,    Left Ventricle: Normal left ventricular systolic function with a visually estimated EF of 55 - 60%. Left ventricle size is normal. Moderate posterior thickening. Normal wall motion. Grade II diastolic dysfunction with increased LAP.    Aortic Valve: Mild sclerosis of the aortic valve cusp.    Mitral Valve: Mild annular calcification of the  mitral valve. Mild regurgitation.    Left Atrium: Left atrium is mildly dilated.    Image quality is good.    01/31/2017,  - Left ventricle: The cavity size was normal. There was moderate     concentric hypertrophy. Systolic function was normal. The     estimated ejection fraction was in the range of 60% to 65%. Wall     motion was normal; there were no regional wall motion     abnormalities. Doppler parameters are consistent with abnormal     left ventricular relaxation (grade 1 diastolic dysfunction). The     E/e&' ratio is between 8-15, suggesting indeterminate LV filling     pressure.   - Mitral valve: Mildly thickened leaflets . There was trivial     regurgitation.   - Left atrium: The atrium was normal in size.   - Inferior vena cava: The vessel was normal in size. The     respirophasic diameter changes were in the normal range (= 50%),     consistent with normal central venous pressure.     Stress Test: 02/01/2017,  Overall Study Impression   Myocardial perfusion is abnormal. This is a low risk study. Overall left ventricular  systolic function was normal. Nuclear stress EF: 61%.     Angiography: Not performed to date  Cardiology Labs: BMP:    Lab Results   Component Value Date/Time    NA 129 02/17/2023 02:40 PM    K 4.2 02/17/2023 02:40 PM    CL 92 02/17/2023 02:40 PM    CO2 24 02/17/2023 02:40 PM    BUN 20 02/17/2023 02:40 PM    CREATININE 1.1 02/17/2023 02:40 PM     CMP:    Lab Results   Component Value Date/Time    NA 129 02/17/2023 02:40 PM    K 4.2 02/17/2023 02:40 PM    CL 92 02/17/2023 02:40 PM    CO2 24 02/17/2023 02:40 PM    BUN 20 02/17/2023 02:40 PM    CREATININE 1.1 02/17/2023 02:40 PM     CBC:    Lab Results   Component Value Date/Time    WBC 7.1 02/17/2023 02:40 PM    RBC 4.00 02/17/2023 02:40 PM    HGB 14.4 02/17/2023 02:40 PM    HCT 40.3 02/17/2023 02:40 PM    MCV 100.8 02/17/2023 02:40 PM    RDW 11.5 02/17/2023 02:40 PM    PLT 213 02/17/2023 02:40 PM     PT/INR:  No results found for:  "PTINR"  PT/INR Warfarin:  No components found for: "PTPATWAR", "PTINRWAR"  PTT:  No results found for: "APTT"  PTT Heparin:  No components found for: "APTTHEP"  Magnesium:  No results found for: "MG"  TSH:    Lab Results   Component Value Date/Time    TSH 0.16 12/24/2022 07:33 AM    TSH 0.16 12/24/2022 07:33 AM     TROPONIN:  No components found for: "TROP"  BNP:  No results found for: "BNP"  FASTING LIPID PANEL:    Lab Results   Component Value Date/Time    CHOL 202 12/24/2022 07:33 AM    HDL 126 12/24/2022 07:33 AM    TRIG 99 12/24/2022 07:33 AM     No orders to display     I have personally reviewed the laboratory, cardiac diagnostic and radiographic testing as outlined above:  Old records from [facility] reviewed and summarized as above    IMPRESSION:  1.  Diastolic congestive heart failure: Compensated, continue current treatment  2.  Mitral valve regurgitation: Mild  3.  Hypertension: Controlled  4.  Hypothyroidism: On Synthroid supplement  5.  Preoperative cardiac evaluation prior to spine surgery: Able to perform activities that require 4 METS without any cardiac complaints    RECOMMENDATIONS:   1.  Patient is at acceptable risk for perioperative cardiovascular event for the planned procedure (required spine surgery), patient may proceed without any further cardiac testing.  Please feel free to call for any further information  2.  Continue current treatment  3.  CHF: Daily weight, take an extra torsemide for weight gain of more than 2-3 pounds in 24 hours, compliance with diuretics, low-salt diet were all advised.  4.  Follow-up with Vilinda Boehringer, APRN-CNP as scheduled  5.  Follow-up with Dr. Arnette Norris in 6 months, sooner if symptomatic for any reason    I have reviewed my findings and recommendations with patient  Electronically signed by Rolan Bucco, MD on 05/27/2023 at 3:41 PM    NOTE: This report was transcribed using voice recognition software. Every effort was made to ensure accuracy;  however, inadvertent computerized transcription errors may be present

## 2023-05-30 ENCOUNTER — Encounter: Payer: MEDICARE | Attending: Family | Primary: Family

## 2023-06-21 LAB — TYPE AND SCREEN
ABO/Rh: O POS
Antibody Screen: NEGATIVE

## 2023-06-23 ENCOUNTER — Encounter

## 2023-06-23 LAB — CULTURE, MRSA, SCREENING: Culture: NEGATIVE

## 2023-06-24 MED ORDER — TORSEMIDE 20 MG PO TABS
20 | ORAL_TABLET | Freq: Every day | ORAL | 2 refills | 30.00000 days | Status: DC
Start: 2023-06-24 — End: 2023-08-26

## 2023-06-28 LAB — CBC
Hematocrit: 37.3 % (ref 34.0–48.0)
Hemoglobin: 12.9 g/dL (ref 11.5–15.5)
MCH: 36.4 pg — ABNORMAL HIGH (ref 26.0–35.0)
MCHC: 34.6 g/dL — ABNORMAL HIGH (ref 32.0–34.5)
MCV: 105.4 fL — ABNORMAL HIGH (ref 80.0–99.9)
MPV: 10.6 fL (ref 7.0–12.0)
Platelets: 162 10*3/uL (ref 130–450)
RBC: 3.54 m/uL (ref 3.50–5.50)
RDW: 11.9 % (ref 11.5–15.0)
WBC: 10.9 10*3/uL (ref 4.5–11.5)

## 2023-06-28 LAB — COMPREHENSIVE METABOLIC PANEL
ALT: 27 U/L (ref 0–32)
AST: 42 U/L — ABNORMAL HIGH (ref 0–31)
Albumin: 4.2 g/dL (ref 3.5–5.2)
Alkaline Phosphatase: 113 U/L — ABNORMAL HIGH (ref 35–104)
Anion Gap: 12 mmol/L (ref 7–16)
BUN: 8 mg/dL (ref 6–23)
CO2: 23 mmol/L (ref 22–29)
Calcium: 9.8 mg/dL (ref 8.6–10.2)
Chloride: 97 mmol/L — ABNORMAL LOW (ref 98–107)
Creatinine: 0.7 mg/dL (ref 0.50–1.00)
Est, Glom Filt Rate: >90 mL/min/{1.73_m2} (ref >60)
Glucose: 125 mg/dL — ABNORMAL HIGH (ref 74–99)
Potassium: 4.6 mmol/L (ref 3.5–5.0)
Sodium: 132 mmol/L (ref 132–146)
Total Bilirubin: 0.5 mg/dL (ref 0.0–1.2)
Total Protein: 6.3 g/dL — ABNORMAL LOW (ref 6.4–8.3)

## 2023-06-29 ENCOUNTER — Inpatient Hospital Stay: Primary: Family

## 2023-06-29 DIAGNOSIS — M4712 Other spondylosis with myelopathy, cervical region: Secondary | ICD-10-CM

## 2023-06-29 LAB — COMPREHENSIVE METABOLIC PANEL
ALT: 20 U/L (ref 0–32)
AST: 27 U/L (ref 0–31)
Albumin: 4 g/dL (ref 3.5–5.2)
Alkaline Phosphatase: 100 U/L (ref 35–104)
Anion Gap: 13 mmol/L (ref 7–16)
BUN: 6 mg/dL (ref 6–23)
CO2: 23 mmol/L (ref 22–29)
Calcium: 10 mg/dL (ref 8.6–10.2)
Chloride: 99 mmol/L (ref 98–107)
Creatinine: 0.7 mg/dL (ref 0.50–1.00)
Est, Glom Filt Rate: >90 mL/min/{1.73_m2} (ref >60)
Glucose: 102 mg/dL — ABNORMAL HIGH (ref 74–99)
Potassium: 4.3 mmol/L (ref 3.5–5.0)
Sodium: 135 mmol/L (ref 132–146)
Total Bilirubin: 0.5 mg/dL (ref 0.0–1.2)
Total Protein: 6.2 g/dL — ABNORMAL LOW (ref 6.4–8.3)

## 2023-06-29 LAB — CBC
Hematocrit: 35.6 % (ref 34.0–48.0)
Hemoglobin: 12.1 g/dL (ref 11.5–15.5)
MCH: 36.6 pg — ABNORMAL HIGH (ref 26.0–35.0)
MCHC: 34 g/dL (ref 32.0–34.5)
MCV: 107.6 fL — ABNORMAL HIGH (ref 80.0–99.9)
MPV: 10.3 fL (ref 7.0–12.0)
Platelets: 151 10*3/uL (ref 130–450)
RBC: 3.31 m/uL — ABNORMAL LOW (ref 3.50–5.50)
RDW: 12.2 % (ref 11.5–15.0)
WBC: 11.9 10*3/uL — ABNORMAL HIGH (ref 4.5–11.5)

## 2023-08-25 ENCOUNTER — Encounter

## 2023-08-26 MED ORDER — TORSEMIDE 20 MG PO TABS
20 | ORAL_TABLET | Freq: Every day | ORAL | 3 refills | 30.00000 days | Status: AC
Start: 2023-08-26 — End: ?

## 2023-09-28 ENCOUNTER — Encounter

## 2023-11-27 ENCOUNTER — Encounter: Payer: MEDICARE | Attending: Internal Medicine | Primary: Family

## 2023-12-26 NOTE — Telephone Encounter (Signed)
"  Spoke w/ pt on phone to discuss another time - she misread message and can do 11/13 @ 1PM.   "

## 2024-01-15 ENCOUNTER — Encounter: Payer: MEDICARE | Attending: Adult Health | Primary: Family

## 2024-01-15 NOTE — Telephone Encounter (Signed)
"  Spoke w pt to move appt up from 1 pm to 8am for 11/13  "

## 2024-01-16 ENCOUNTER — Encounter: Payer: MEDICARE | Attending: Adult Health | Primary: Family

## 2024-01-16 NOTE — Progress Notes (Deleted)
 "     OUTPATIENT [facility]    Date of Visit: 01/14/2024    Primary Cardiologist: Dr. Lanie     HISTORY OF PRESENT ILLNESS:   Margaret Zimmerman  is a 73 y.o. Caucasian female who follows with Dr. Lanie. She was most recently seen in the office with Dr. Lanie on 05/27/2023. Her medical history as stated below.     Margaret Zimmerman presents in office visit today regarding           Please note: past medical records were reviewed per electronic medical record (EMR) - see detailed reports under Past Medical/ Surgical History.   Past Medical and Surgical History:  Chronic HFpEF  TTE 01/31/2017: Left ventricle: The cavity size was normal. There was moderate concentric hypertrophy. Systolic function was normal. The estimated ejection fraction was in the range of 60% to 65%. Wall   motion was normal; there were no regional wall motion abnormalities. Doppler parameters are consistent with abnormal left ventricular relaxation (grade 1 diastolic dysfunction). The E/e&' ratio is between 8-15, suggesting indeterminate LV filling pressure. Mitral valve: Mildly thickened leaflets . There was trivial    regurgitation. Left atrium: The atrium was normal in size. Inferior vena cava: The vessel was normal in size. The respirophasic diameter changes were in the normal range (= 50%),   consistent with normal central venous pressure.   TTE 08/17/2022 (Dr. Lanie): Left Ventricle: Normal left ventricular systolic function with a visually estimated EF of 55 - 60%. Left ventricle size is normal. Moderate posterior thickening. Normal wall motion. Grade II diastolic dysfunction with increased LAP. Aortic Valve: Mild sclerosis of the aortic valve cusp. Mitral Valve: Mild annular calcification of the mitral valve. Mild regurgitation. Left Atrium: Left atrium is mildly dilated. Image quality is good.  HTN  T2DM  Obesity s/p Lap Band  Hypothyroidism, on replacement therapy   TSH 1.42 (04/12/2022)  TSH 0.16 (12/24/2022)  Spinal Stenosis s/p  lumbar laminectomy   Diverticular Disease   Bipolar 1 Disorder   S/p Appendectomy, cholecystectomy, hysterectomy, meniscus repair            PAST MEDICAL HISTORY:    Past Medical History:   Diagnosis Date    Bipolar 1 disorder (HCC)     Diverticular disease     Hypertension     Hypothyroidism     Spinal stenosis        PAST SURGICAL HISTORY:    Past Surgical History:   Procedure Laterality Date    APPENDECTOMY      BACK SURGERY      CHOLECYSTECTOMY      CHOLECYSTECTOMY      COLONOSCOPY      COLONOSCOPY      HYSTERECTOMY (CERVIX STATUS UNKNOWN)      KNEE SURGERY      torn meninscus    LAP BAND      LUMBAR LAMINECTOMY      OVARY REMOVAL      UPPER GASTROINTESTINAL ENDOSCOPY N/A 09/04/2022    ESOPHAGOGASTRODUODENOSCOPY BIOPSY performed by Eliza Charleston, MD at [facility] ENDOSCOPY       HOME MEDICATIONS:  Prior to Admission medications   Medication Sig Start Date End Date Taking? Authorizing Provider   torsemide  (DEMADEX ) 20 MG tablet TAKE 2 TABLETS BY MOUTH DAILY 08/26/23   Jeanie Argentina HERO, MD   Ascorbic Acid (VITAMIN C) 1000 MG tablet Take 1 tablet by mouth daily    [provider]   vitamin B-6 (PYRIDOXINE) 100 MG  tablet Take 1 tablet by mouth daily    [provider]   dilTIAZem  (CARDIZEM  12 HR) 120 MG extended release capsule Take 1 capsule by mouth daily    [provider]   omeprazole  (PRILOSEC) 40 MG delayed release capsule Take 1 capsule by mouth daily    [provider]   atenolol  (TENORMIN ) 25 MG tablet Take 1 tablet by mouth daily 04/19/23   Zena Browning, APRN - CNP   valsartan  (DIOVAN ) 320 MG tablet Take 1 tablet by mouth daily 02/25/23   Zena Browning, APRN - CNP   levothyroxine  (SYNTHROID ) 100 MCG tablet Take 1 tablet by mouth daily  Patient taking differently: Take 2.5 tablets by mouth daily 12/25/22   Zena Browning, APRN - CNP   metFORMIN  (GLUCOPHAGE ) 500 MG tablet Take 1 tablet by mouth 2 times daily (with meals)  Patient not taking: Reported on 05/27/2023  12/19/22   Zena Browning, APRN - CNP   pantoprazole  (PROTONIX ) 40 MG tablet TAKE 1 TABLET BY MOUTH EVERY MORNING BEFORE BREAKFAST  Patient not taking: Reported on 05/27/2023 11/14/22   Zena Browning, APRN - CNP   Cholecalciferol (VITAMIN D3) 1000 units CAPS Take 1 capsule by mouth daily    [provider]   Multiple Vitamin (MULTIVITAMIN ADULT PO) Take 1 capsule by mouth daily    [provider]   B-Complex TABS Take 1 tablet by mouth daily    [provider]   buPROPion (WELLBUTRIN XL) 150 MG extended release tablet Take 1 tablet by mouth every morning    [provider]   lamoTRIgine (LAMICTAL) 200 MG tablet Take 1 tablet by mouth daily    [provider]   zinc  sulfate (ZINCATE) 220 (50 Zn) MG capsule Take 1 capsule by mouth daily    [provider]   ALPRAZolam (XANAX) 1 MG tablet Take 1 tablet by mouth daily as needed for Anxiety. 2 a day as needed    [provider]       CURRENT MEDICATIONS:      Current Outpatient Medications:     torsemide  (DEMADEX ) 20 MG tablet, TAKE 2 TABLETS BY MOUTH DAILY, Disp: 180 tablet, Rfl: 3    Ascorbic Acid (VITAMIN C) 1000 MG tablet, Take 1 tablet by mouth daily, Disp: , Rfl:     vitamin B-6 (PYRIDOXINE) 100 MG tablet, Take 1 tablet by mouth daily, Disp: , Rfl:     dilTIAZem  (CARDIZEM  12 HR) 120 MG extended release capsule, Take 1 capsule by mouth daily, Disp: , Rfl:     omeprazole  (PRILOSEC) 40 MG delayed release capsule, Take 1 capsule by mouth daily, Disp: , Rfl:     atenolol  (TENORMIN ) 25 MG tablet, Take 1 tablet by mouth daily, Disp: 90 tablet, Rfl: 1    valsartan  (DIOVAN ) 320 MG tablet, Take 1 tablet by mouth daily, Disp: 90 tablet, Rfl: 1    levothyroxine  (SYNTHROID ) 100 MCG tablet, Take 1 tablet by mouth daily (Patient taking differently: Take 2.5 tablets by mouth daily), Disp: 90 tablet, Rfl: 1    metFORMIN  (GLUCOPHAGE ) 500 MG tablet, Take 1 tablet by mouth 2 times daily (with meals) (Patient not taking:  Reported on 05/27/2023), Disp: 180 tablet, Rfl: 1    pantoprazole  (PROTONIX ) 40 MG tablet, TAKE 1 TABLET BY MOUTH EVERY MORNING BEFORE BREAKFAST (Patient not taking: Reported on 05/27/2023), Disp: 90 tablet, Rfl: 1    Cholecalciferol (VITAMIN D3) 1000 units CAPS, Take 1 capsule by mouth daily, Disp: , Rfl:  Multiple Vitamin (MULTIVITAMIN ADULT PO), Take 1 capsule by mouth daily, Disp: , Rfl:     B-Complex TABS, Take 1 tablet by mouth daily, Disp: , Rfl:     buPROPion (WELLBUTRIN XL) 150 MG extended release tablet, Take 1 tablet by mouth every morning, Disp: , Rfl:     lamoTRIgine (LAMICTAL) 200 MG tablet, Take 1 tablet by mouth daily, Disp: , Rfl:     zinc  sulfate (ZINCATE) 220 (50 Zn) MG capsule, Take 1 capsule by mouth daily, Disp: , Rfl:     ALPRAZolam (XANAX) 1 MG tablet, Take 1 tablet by mouth daily as needed for Anxiety. 2 a day as needed, Disp: , Rfl:       ALLERGIES:  Amlodipine    SOCIAL HISTORY:    Tobacco:  EtOH:  Drug use:  Functional capacity:  Social History     Socioeconomic History    Marital status: Divorced     Spouse name: Not on file    Number of children: Not on file    Years of education: Not on file    Highest education level: Not on file   Occupational History    Not on file   Tobacco Use    Smoking status: Former     Types: Cigarettes    Smokeless tobacco: Never    Tobacco comments:     Smoked on and off.  Haven't smoked for about 20 years   Vaping Use    Vaping status: Every Day    Substances: THC   Substance and Sexual Activity    Alcohol use: Yes     Comment: wine daily    Drug use: Yes     Types: Marijuana Oda)     Comment: THC/medical marijuana card    Sexual activity: Not Currently     Partners: Male   Other Topics Concern    Not on file   Social History Narrative    Not on file     Social Drivers of Health     Financial Resource Strain: Low Risk  (09/24/2023)    Received from [facility]    Overall Financial Resource Strain (CARDIA)     Difficulty of Paying Living Expenses: Not  hard at all   Food Insecurity: No Food Insecurity (09/24/2023)    Received from [facility]    Hunger Vital Sign     Within the past 12 months, you worried that your food would run out before you got the money to buy more.: Never true     Within the past 12 months, the food you bought just didn't last and you didn't have money to get more.: Never true   Transportation Needs: No Transportation Needs (09/24/2023)    Received from [facility] - Transportation     Lack of Transportation (Medical): No     Lack of Transportation (Non-Medical): No   Physical Activity: Inactive (10/28/2023)    Received from [facility]    Exercise Vital Sign     On average, how many days per week do you engage in moderate to strenuous exercise (like a brisk walk)?: 0 days     On average, how many minutes do you engage in exercise at this level?: 0 min   Stress: No Stress Concern Present (09/24/2023)    Received from [facility] - Occupational Stress Questionnaire     Feeling of Stress : Not at all  Social Connections: Socially Isolated (09/24/2023)    Received from [facility]    Social Connection and Isolation Panel     In a typical week, how many times do you talk on the phone with family, friends, or neighbors?: Twice a week     How often do you get together with friends or relatives?: Never     How often do you attend church or religious services?: Never     Do you belong to any clubs or organizations such as church groups, unions, fraternal or athletic groups, or school groups?: No     How often do you attend meetings of the clubs or organizations you belong to?: Never     Are you married, widowed, divorced, separated, never married, or living with a partner?: Divorced   Intimate Partner Violence: Not on file   Housing Stability: Unknown (09/24/2023)    Received from [facility] Stability Vital Sign     In the last 12 months, was there a time  when you were not able to pay the mortgage or rent on time?: No     Number of Times Moved in the Last Year: Not on file     Homeless in the Last Year: Not on file       FAMILY HISTORY:   Please note this information was not obtained at this time, as it is limited in nature due to the patient's advanced age.         REVIEW OF SYSTEMS:     Negative except as noted above in HPI.    PHYSICAL EXAM:   There were no vitals taken for this visit.  CONST:  Well developed, well nourished who appears stated age. Awake, alert, cooperative, no apparent distress.  HEENT:   Head- Normocephalic, atraumatic.   Eyes- Conjunctivae pink, anicteric.  Neck-  No stridor, trachea midline, no apparent jugular venous distention.   CHEST: Chest symmetrical and non-tender to palpation. No accessory muscle use or intercostal retractions.  RESPIRATORY: Lung sounds - clear throughout fields. No wheezing, rales or rhonchi.  CARDIOVASCULAR:     No noted carotid bruit.  Heart Ausculation- Regular rate and rhythm, no apparent murmur.   PV: No lower extremity edema. No varicosities. Pedal pulses palpable, no clubbing or cyanosis.   ABDOMEN: Soft, non-tender to light palpation. Bowel sounds present.   MS: Good muscle strength and tone. No atrophy or abnormal movements.   GU: Deferred  SKIN: Warm and dry. No statis dermatitis or ulcers.  NEURO / PSYCH: Oriented to person, place and time. Speech clear and appropriate. Follows all commands. Pleasant affect.      DATA:    EKG:     Reviewed.  Final read as noted under Cardiology tab.    Labs:   CBC:   Lab Results   Component Value Date    WBC 11.9 (H) 06/29/2023    HGB 12.1 06/29/2023    HCT 35.6 06/29/2023    MCV 107.6 (H) 06/29/2023    PLT 151 06/29/2023       BMP:   Lab Results   Component Value Date/Time    NA 135 06/29/2023 04:00 AM    K 4.3 06/29/2023 04:00 AM    CL 99 06/29/2023 04:00 AM    CO2 23 06/29/2023 04:00 AM    BUN 6 06/29/2023 04:00 AM    CREATININE 0.7 06/29/2023 04:00 AM    GLUCOSE 102  06/29/2023 04:00 AM    CALCIUM  10.0 06/29/2023 04:00 AM    LABGLOM >90 06/29/2023 04:00 AM    LABGLOM 61 06/08/2022 12:05 PM        TSH:   Lab Results   Component Value Date    TSH 0.16 (L) 12/24/2022    TSH 0.16 (L) 12/24/2022      HgA1c:   Lab Results   Component Value Date    LABA1C 5.1 12/24/2022   FASTING LIPID PANEL:  Lab Results   Component Value Date    CHOL 202 (H) 12/24/2022    CHOL 182 04/12/2022     Lab Results   Component Value Date    TRIG 99 12/24/2022    TRIG 112 04/12/2022     Lab Results   Component Value Date    HDL 126 12/24/2022    HDL 888 04/12/2022     Lab Results   Component Value Date    LDL 56 12/24/2022    LDL 49 04/12/2022     Lab Results   Component Value Date    VLDL 20 12/24/2022    VLDL 22 04/12/2022     LIVER PROFILE:  Lab Results   Component Value Date    ALT 20 06/29/2023    AST 27 06/29/2023    ALKPHOS 100 06/29/2023    BILITOT 0.5 06/29/2023          ASSESSMENT:            RECOMMENDATIONS:             The patient's current medication list, allergies, problem list, recent labs and diagnostic testing were reviewed at today's visit.    NOTE: This report was transcribed using voice recognition software. Every effort was made to ensure accuracy; however, inadvertent computerized transcription errors may be present.    Asberry Code, APRN - CNP   [facility]    Electronically signed by Asberry Code, APRN - CNP on 01/14/2024 at 10:18 AM   "
